# Patient Record
Sex: Male | Born: 1955
Health system: Southern US, Community
[De-identification: ages and names within clinical notes are randomized; demographics above are authoritative.]

## PROBLEM LIST (undated history)

## (undated) DIAGNOSIS — R7989 Other specified abnormal findings of blood chemistry: Secondary | ICD-10-CM

## (undated) DIAGNOSIS — M1990 Unspecified osteoarthritis, unspecified site: Secondary | ICD-10-CM

## (undated) DIAGNOSIS — G5600 Carpal tunnel syndrome, unspecified upper limb: Secondary | ICD-10-CM

## (undated) DIAGNOSIS — Z9889 Other specified postprocedural states: Secondary | ICD-10-CM

## (undated) DIAGNOSIS — T4145XA Adverse effect of unspecified anesthetic, initial encounter: Secondary | ICD-10-CM

## (undated) DIAGNOSIS — E785 Hyperlipidemia, unspecified: Secondary | ICD-10-CM

## (undated) DIAGNOSIS — R112 Nausea with vomiting, unspecified: Secondary | ICD-10-CM

## (undated) DIAGNOSIS — R7303 Prediabetes: Secondary | ICD-10-CM

## (undated) DIAGNOSIS — K5792 Diverticulitis of intestine, part unspecified, without perforation or abscess without bleeding: Secondary | ICD-10-CM

## (undated) DIAGNOSIS — T8859XA Other complications of anesthesia, initial encounter: Secondary | ICD-10-CM

## (undated) DIAGNOSIS — K5720 Diverticulitis of large intestine with perforation and abscess without bleeding: Secondary | ICD-10-CM

## (undated) DIAGNOSIS — L4050 Arthropathic psoriasis, unspecified: Secondary | ICD-10-CM

## (undated) DIAGNOSIS — I10 Essential (primary) hypertension: Secondary | ICD-10-CM

## (undated) HISTORY — PX: CARPAL TUNNEL RELEASE: SHX101

## (undated) HISTORY — PX: GANGLION CYST EXCISION: SHX1691

## (undated) HISTORY — PX: CERVICAL FUSION: SHX112

## (undated) HISTORY — PX: OTHER SURGICAL HISTORY: SHX169

## (undated) HISTORY — PX: TYMPANOSTOMY TUBE PLACEMENT: SHX32

## (undated) HISTORY — PX: ARTHROSCOPIC REPAIR ACL: SUR80

## (undated) HISTORY — PX: TONSILLECTOMY: SUR1361

## (undated) HISTORY — PX: SHOULDER ARTHROSCOPY W/ ROTATOR CUFF REPAIR: SHX2400

## (undated) HISTORY — DX: Unspecified osteoarthritis, unspecified site: M19.90

---
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
## 2000-07-11 ENCOUNTER — Encounter: Admission: RE | Admit: 2000-07-11 | Discharge: 2000-07-20 | Payer: Self-pay | Admitting: Orthopedic Surgery

## 2000-08-10 ENCOUNTER — Ambulatory Visit (HOSPITAL_COMMUNITY): Admission: RE | Admit: 2000-08-10 | Discharge: 2000-08-10 | Payer: Self-pay | Admitting: Orthopedic Surgery

## 2000-08-10 ENCOUNTER — Encounter: Payer: Self-pay | Admitting: Orthopedic Surgery

## 2002-09-12 ENCOUNTER — Encounter: Admission: RE | Admit: 2002-09-12 | Discharge: 2002-09-12 | Payer: Self-pay | Admitting: Family Medicine

## 2002-09-12 ENCOUNTER — Encounter: Payer: Self-pay | Admitting: Family Medicine

## 2003-11-04 ENCOUNTER — Ambulatory Visit (HOSPITAL_COMMUNITY): Admission: RE | Admit: 2003-11-04 | Discharge: 2003-11-05 | Payer: Self-pay | Admitting: Orthopaedic Surgery

## 2005-04-13 ENCOUNTER — Ambulatory Visit (HOSPITAL_COMMUNITY): Admission: RE | Admit: 2005-04-13 | Discharge: 2005-04-13 | Payer: Self-pay | Admitting: Orthopedic Surgery

## 2005-06-15 ENCOUNTER — Ambulatory Visit (HOSPITAL_BASED_OUTPATIENT_CLINIC_OR_DEPARTMENT_OTHER): Admission: RE | Admit: 2005-06-15 | Discharge: 2005-06-15 | Payer: Self-pay | Admitting: Orthopedic Surgery

## 2011-02-27 ENCOUNTER — Other Ambulatory Visit: Payer: Self-pay | Admitting: Family Medicine

## 2011-02-27 DIAGNOSIS — N508 Other specified disorders of male genital organs: Secondary | ICD-10-CM

## 2011-02-28 ENCOUNTER — Ambulatory Visit
Admission: RE | Admit: 2011-02-28 | Discharge: 2011-02-28 | Disposition: A | Discharge status: Home / Self Care | Payer: 59 | Source: Ambulatory Visit | Attending: Family Medicine | Admitting: Family Medicine

## 2011-02-28 DIAGNOSIS — N508 Other specified disorders of male genital organs: Secondary | ICD-10-CM

## 2011-05-18 ENCOUNTER — Other Ambulatory Visit: Payer: Self-pay | Admitting: Orthopedic Surgery

## 2011-05-23 ENCOUNTER — Encounter (HOSPITAL_BASED_OUTPATIENT_CLINIC_OR_DEPARTMENT_OTHER): Payer: Self-pay | Admitting: *Deleted

## 2011-05-23 NOTE — Progress Notes (Signed)
To come in for [id number] bring names of meds

## 2011-05-24 ENCOUNTER — Encounter (HOSPITAL_BASED_OUTPATIENT_CLINIC_OR_DEPARTMENT_OTHER)
Admission: RE | Admit: 2011-05-24 | Discharge: 2011-05-24 | Disposition: A | Discharge status: Home / Self Care | Payer: 59 | Source: Ambulatory Visit | Attending: Orthopedic Surgery | Admitting: Orthopedic Surgery

## 2011-05-24 ENCOUNTER — Other Ambulatory Visit: Payer: Self-pay

## 2011-05-24 LAB — BASIC METABOLIC PANEL
CO2: 31 mEq/L (ref 19–32)
Chloride: 101 mEq/L (ref 96–112)
GFR calc non Af Amer: >90 mL/min (ref >90)
Glucose, Bld: 107 mg/dL — ABNORMAL HIGH (ref 70–99)
Sodium: 141 mEq/L (ref 135–145)

## 2011-05-25 ENCOUNTER — Encounter (HOSPITAL_BASED_OUTPATIENT_CLINIC_OR_DEPARTMENT_OTHER): Payer: Self-pay | Admitting: Anesthesiology

## 2011-05-25 ENCOUNTER — Encounter (HOSPITAL_BASED_OUTPATIENT_CLINIC_OR_DEPARTMENT_OTHER): Payer: Self-pay | Admitting: Orthopedic Surgery

## 2011-05-25 ENCOUNTER — Ambulatory Visit (HOSPITAL_BASED_OUTPATIENT_CLINIC_OR_DEPARTMENT_OTHER)
Admission: RE | Admit: 2011-05-25 | Discharge: 2011-05-25 | Disposition: A | Discharge status: Home / Self Care | Payer: 59 | Source: Ambulatory Visit | Attending: Orthopedic Surgery | Admitting: Orthopedic Surgery

## 2011-05-25 ENCOUNTER — Encounter (HOSPITAL_BASED_OUTPATIENT_CLINIC_OR_DEPARTMENT_OTHER): Payer: Self-pay | Admitting: Certified Registered"

## 2011-05-25 ENCOUNTER — Ambulatory Visit (HOSPITAL_BASED_OUTPATIENT_CLINIC_OR_DEPARTMENT_OTHER): Payer: 59 | Admitting: Anesthesiology

## 2011-05-25 ENCOUNTER — Encounter (HOSPITAL_BASED_OUTPATIENT_CLINIC_OR_DEPARTMENT_OTHER): Payer: Self-pay | Admitting: *Deleted

## 2011-05-25 ENCOUNTER — Encounter (HOSPITAL_BASED_OUTPATIENT_CLINIC_OR_DEPARTMENT_OTHER)
Admission: RE | Disposition: A | Discharge status: Home / Self Care | Payer: Self-pay | Source: Ambulatory Visit | Attending: Orthopedic Surgery

## 2011-05-25 DIAGNOSIS — Z01812 Encounter for preprocedural laboratory examination: Secondary | ICD-10-CM | POA: Insufficient documentation

## 2011-05-25 DIAGNOSIS — I10 Essential (primary) hypertension: Secondary | ICD-10-CM | POA: Insufficient documentation

## 2011-05-25 DIAGNOSIS — Z01810 Encounter for preprocedural cardiovascular examination: Secondary | ICD-10-CM | POA: Insufficient documentation

## 2011-05-25 DIAGNOSIS — G5600 Carpal tunnel syndrome, unspecified upper limb: Secondary | ICD-10-CM | POA: Insufficient documentation

## 2011-05-25 HISTORY — DX: Other specified postprocedural states: Z98.890

## 2011-05-25 HISTORY — DX: Other specified postprocedural states: R11.2

## 2011-05-25 HISTORY — DX: Other complications of anesthesia, initial encounter: T88.59XA

## 2011-05-25 HISTORY — DX: Adverse effect of unspecified anesthetic, initial encounter: T41.45XA

## 2011-05-25 HISTORY — PX: CARPAL TUNNEL RELEASE: SHX101

## 2011-05-25 HISTORY — DX: Carpal tunnel syndrome, unspecified upper limb: G56.00

## 2011-05-25 HISTORY — DX: Essential (primary) hypertension: I10

## 2011-05-25 HISTORY — DX: Hyperlipidemia, unspecified: E78.5

## 2011-05-25 LAB — POCT HEMOGLOBIN-HEMACUE: Hemoglobin: 16.3 g/dL (ref 13.0–17.0)

## 2011-05-25 SURGERY — CARPAL TUNNEL RELEASE
Anesthesia: General | Site: Wrist | Laterality: Right | Wound class: Clean

## 2011-05-25 MED ORDER — MIDAZOLAM HCL 5 MG/5ML IJ SOLN
INTRAMUSCULAR | Status: DC | PRN
  Administered 2011-05-25: 2 mg via INTRAVENOUS

## 2011-05-25 MED ORDER — CHLORHEXIDINE GLUCONATE 4 % EX LIQD: 60.0000 mL | Freq: Once | CUTANEOUS | Status: DC

## 2011-05-25 MED ORDER — LIDOCAINE HCL 2 % IJ SOLN
INTRAMUSCULAR | Status: DC | PRN
  Administered 2011-05-25: 4.5 mL

## 2011-05-25 MED ORDER — PROPOFOL 10 MG/ML IV EMUL
INTRAVENOUS | Status: DC | PRN
  Administered 2011-05-25: 200 mg via INTRAVENOUS

## 2011-05-25 MED ORDER — DEXAMETHASONE SODIUM PHOSPHATE 10 MG/ML IJ SOLN
INTRAMUSCULAR | Status: DC | PRN
  Administered 2011-05-25: 10 mg via INTRAVENOUS

## 2011-05-25 MED ORDER — ONDANSETRON HCL 4 MG/2ML IJ SOLN
INTRAMUSCULAR | Status: DC | PRN
  Administered 2011-05-25: 4 mg via INTRAVENOUS

## 2011-05-25 MED ORDER — FENTANYL CITRATE 0.05 MG/ML IJ SOLN
INTRAMUSCULAR | Status: DC | PRN
  Administered 2011-05-25: 25 ug via INTRAVENOUS
  Administered 2011-05-25: 100 ug via INTRAVENOUS

## 2011-05-25 MED ORDER — MEPERIDINE HCL 25 MG/ML IJ SOLN: 6.2500 mg | INTRAMUSCULAR | Status: DC | PRN

## 2011-05-25 MED ORDER — FENTANYL CITRATE 0.05 MG/ML IJ SOLN: 25.0000 ug | INTRAMUSCULAR | Status: DC | PRN

## 2011-05-25 MED ORDER — LACTATED RINGERS IV SOLN
INTRAVENOUS | Status: DC
  Administered 2011-05-25 (×2): via INTRAVENOUS

## 2011-05-25 MED ORDER — OXYCODONE-ACETAMINOPHEN 5-325 MG PO TABS
1.0000 | ORAL_TABLET | Freq: Once | ORAL | Status: AC
  Administered 2011-05-25: 1 via ORAL

## 2011-05-25 MED ORDER — LIDOCAINE HCL (CARDIAC) 20 MG/ML IV SOLN
INTRAVENOUS | Status: DC | PRN
  Administered 2011-05-25: 60 mg via INTRAVENOUS

## 2011-05-25 MED ORDER — OXYCODONE-ACETAMINOPHEN 5-325 MG PO TABS: ORAL_TABLET | ORAL | Status: DC

## 2011-05-25 MED ORDER — PROMETHAZINE HCL 25 MG/ML IJ SOLN: 6.2500 mg | INTRAMUSCULAR | Status: DC | PRN

## 2011-05-25 SURGICAL SUPPLY — 42 items
BANDAGE ADHESIVE 1X3 (GAUZE/BANDAGES/DRESSINGS) IMPLANT
BANDAGE ELASTIC 3 VELCRO ST LF (GAUZE/BANDAGES/DRESSINGS) ×2 IMPLANT
BLADE SURG 15 STRL LF DISP TIS (BLADE) ×1 IMPLANT
BLADE SURG 15 STRL SS (BLADE) ×2
BNDG CMPR 9X4 STRL LF SNTH (GAUZE/BANDAGES/DRESSINGS) ×1
BNDG ESMARK 4X9 LF (GAUZE/BANDAGES/DRESSINGS) ×2 IMPLANT
BRUSH SCRUB EZ PLAIN DRY (MISCELLANEOUS) ×2 IMPLANT
CLOSURE STERI STRIP 1/2 X4 (GAUZE/BANDAGES/DRESSINGS) ×2 IMPLANT
CLOTH BEACON ORANGE TIMEOUT ST (SAFETY) ×2 IMPLANT
CORDS BIPOLAR (ELECTRODE) ×2 IMPLANT
COVER MAYO STAND STRL (DRAPES) ×2 IMPLANT
COVER TABLE BACK 60X90 (DRAPES) ×2 IMPLANT
CUFF TOURNIQUET SINGLE 18IN (TOURNIQUET CUFF) ×2 IMPLANT
DECANTER SPIKE VIAL GLASS SM (MISCELLANEOUS) ×2 IMPLANT
DRAPE EXTREMITY T 121X128X90 (DRAPE) ×2 IMPLANT
DRAPE SURG 17X23 STRL (DRAPES) ×2 IMPLANT
GLOVE BIO SURGEON STRL SZ 6.5 (GLOVE) ×4 IMPLANT
GLOVE BIOGEL M STRL SZ7.5 (GLOVE) ×2 IMPLANT
GLOVE ORTHO TXT STRL SZ7.5 (GLOVE) ×2 IMPLANT
GOWN PREVENTION PLUS XLARGE (GOWN DISPOSABLE) ×2 IMPLANT
GOWN PREVENTION PLUS XXLARGE (GOWN DISPOSABLE) ×4 IMPLANT
NEEDLE 27GAX1X1/2 (NEEDLE) ×2 IMPLANT
PACK BASIN DAY SURGERY FS (CUSTOM PROCEDURE TRAY) ×2 IMPLANT
PAD CAST 3X4 CTTN HI CHSV (CAST SUPPLIES) ×1 IMPLANT
PADDING CAST ABS 4INX4YD NS (CAST SUPPLIES) ×1
PADDING CAST ABS COTTON 4X4 ST (CAST SUPPLIES) ×1 IMPLANT
PADDING CAST COTTON 3X4 STRL (CAST SUPPLIES) ×1
PADDING WEBRIL 3 STERILE (GAUZE/BANDAGES/DRESSINGS) ×2 IMPLANT
SPLINT PLASTER CAST XFAST 3X15 (CAST SUPPLIES) ×5 IMPLANT
SPLINT PLASTER EXTRA FAST 3X15 (CAST SUPPLIES) ×1
SPLINT PLASTER GYPS XFAST 3X15 (CAST SUPPLIES) ×1 IMPLANT
SPLINT PLASTER XTRA FASTSET 3X (CAST SUPPLIES) ×5
SPONGE GAUZE 4X4 12PLY (GAUZE/BANDAGES/DRESSINGS) ×2 IMPLANT
SPONGE GAUZE 4X4 16PLY NONSTR (GAUZE/BANDAGES/DRESSINGS) ×2 IMPLANT
STOCKINETTE 4X48 STRL (DRAPES) ×2 IMPLANT
STRIP CLOSURE SKIN 1/2X4 (GAUZE/BANDAGES/DRESSINGS) ×2 IMPLANT
SUT PROLENE 3 0 PS 2 (SUTURE) ×2 IMPLANT
SYR 3ML 23GX1 SAFETY (SYRINGE) IMPLANT
SYR CONTROL 10ML LL (SYRINGE) ×2 IMPLANT
TRAY DSU PREP LF (CUSTOM PROCEDURE TRAY) ×2 IMPLANT
UNDERPAD 30X30 INCONTINENT (UNDERPADS AND DIAPERS) ×2 IMPLANT
WATER STERILE IRR 1000ML POUR (IV SOLUTION) ×2 IMPLANT

## 2011-05-25 NOTE — Anesthesia Procedure Notes (Signed)
Procedure Name: LMA Insertion Date/Time: 05/25/2011 10:46 AM Performed by: Radford Pax Pre-anesthesia Checklist: Patient identified, Emergency Drugs available, Suction available, Patient being monitored and Timeout performed Patient Re-evaluated:Patient Re-evaluated prior to inductionOxygen Delivery Method: Circle System Utilized Preoxygenation: Pre-oxygenation with 100% oxygen Intubation Type: IV induction Ventilation: Mask ventilation without difficulty LMA: LMA with gastric port inserted LMA Size: 4.0 Number of attempts: 1 (atraumatic) Placement Confirmation: positive ETCO2 Tube secured with: Tape Dental Injury: Teeth and Oropharynx as per pre-operative assessment

## 2011-05-25 NOTE — Anesthesia Postprocedure Evaluation (Signed)
  Anesthesia Post-op Note  Patient: John Morse  Procedure(s) Performed:  CARPAL TUNNEL RELEASE  Patient Location: PACU  Anesthesia Type: General  Level of Consciousness: awake, alert  and oriented  Airway and Oxygen Therapy: Patient Spontanous Breathing  Post-op Pain: none  Post-op Assessment: Post-op Vital signs reviewed, Patient's Cardiovascular Status Stable, Respiratory Function Stable, Patent Airway and No signs of Nausea or vomiting  Post-op Vital Signs: Reviewed and stable  Complications: No apparent anesthesia complications

## 2011-05-25 NOTE — H&P (Signed)
John Morse is an 56 y.o. male.   Chief Complaint: Complaining of chronic and progressive right hand numbness and tingling HPI: Patient is a 56 year old right-hand-dominant male who presented to our office recently for evaluation and treatment of chronic and progressive right hand numbness and tingling. He previously undergone left carpal tunnel release by Dr. Katy Fitch. John Morse with good success. He is currently awake and 5 times a week despite nocturnal splinting. Review of his nerve conduction studies revealed a moderate carpal tunnel syndrome. Patient wishes to proceed with surgical intervention.  Past Medical History  Diagnosis Date  . Complication of anesthesia     vomiting  . PONV (postoperative nausea and vomiting)   . Hypertension   . Hyperlipemia   . Carpal tunnel syndrome     rt    Past Surgical History  Procedure Date  . Shoulder arthroscopy w/ rotator cuff repair     rightx3  . Shoulder arthroscopy w/ rotator cuff repair     left   . Arthroscopic repair acl     rt knee  . Cervical fusion   . Carpal tunnel release     lt  . Tonsillectomy     History reviewed. No pertinent family history. Social History:  reports that he quit smoking about 14 years ago. He does not have any smokeless tobacco history on file. He reports that he drinks alcohol. He reports that he does not use illicit drugs.  Allergies:  Allergies  Allergen Reactions  . Codeine Nausea And Vomiting    Medications Prior to Admission  Medication Dose Route Frequency Provider Last Rate Last Dose  . chlorhexidine (HIBICLENS) 4 % liquid 4 application  60 mL Topical Once       . lactated ringers infusion   Intravenous Continuous Constance Goltz, MD       Medications Prior to Admission  Medication Sig Dispense Refill  . amLODipine (NORVASC) 2.5 MG tablet Take 2.5 mg by mouth daily.      Marland Kitchen atenolol-chlorthalidone (TENORETIC) 50-25 MG per tablet Take 1 tablet by mouth daily.      . folic acid  (FOLVITE) 400 MCG tablet Take 400 mcg by mouth daily.      . pravastatin (PRAVACHOL) 40 MG tablet Take 40 mg by mouth daily.      . vitamin E 400 UNIT capsule Take 400 Units by mouth daily.        Results for orders placed during the hospital encounter of 05/25/11 (from the past 48 hour(s))  BASIC METABOLIC PANEL     Status: Abnormal   Collection Time   05/24/11  8:30 AM      Component Value Range Comment   Sodium 141  135 - 145 (mEq/L)    Potassium 3.4 (*) 3.5 - 5.1 (mEq/L)    Chloride 101  96 - 112 (mEq/L)    CO2 31  19 - 32 (mEq/L)    Glucose, Bld 107 (*) 70 - 99 (mg/dL)    BUN 17  6 - 23 (mg/dL)    Creatinine, Ser 1.47  0.50 - 1.35 (mg/dL)    Calcium 9.4  8.4 - 10.5 (mg/dL)    GFR calc non Af Amer >90  >90 (mL/min)    GFR calc Af Amer >90  >90 (mL/min)     No results found.   Pertinent items are noted in HPI.  Height 6' (1.829 m), weight 106.595 kg (235 lb).  General appearance: alert Head: Normocephalic, without obvious abnormality Neck: supple,  symmetrical, trachea midline Resp: clear to auscultation bilaterally Cardio: regular rate and rhythm, S1, S2 normal, no murmur, click, rub or gallop GI: normal findings: bowel sounds normal Extremities: Examination of his right hand reveals a positive Phalen's and positive Tinel's. He has normal sweat pattern. He has full range of motion of his fingers and wrist. Review of his nerve conduction studies reveals a moderate right carpal tunnel syndrome. Pulses: 2+ and symmetric Skin: normal Neurologic: Grossly normal    Assessment/Plan Impression: Right carpal tunnel syndrome  Plan: Patient to be taken to the operating room to undergo right carpal tunnel release. The procedure risks benefits and postoperative course were discussed with the patient at length and he was in agreement with this plan.  DASNOIT,Davanna He J 05/25/2011, 9:05 AM    H&P documentation: 05/25/2011  -History and Physical Reviewed  -Patient has been  re-examined  -No change in the plan of care  Wyn Forster, MD

## 2011-05-25 NOTE — Op Note (Signed)
OP NOTE DICTATED: 05/25/11 191478

## 2011-05-25 NOTE — Anesthesia Preprocedure Evaluation (Signed)
Anesthesia Evaluation  Patient identified by MRN, date of birth, ID band Patient awake    Reviewed: Allergy & Precautions, H&P , NPO status , Patient's Chart, lab work & pertinent test results, reviewed documented beta blocker date and time   History of Anesthesia Complications (+) PONV  Airway Mallampati: I TM Distance: >3 FB Neck ROM: Full    Dental No notable dental hx. (+) Teeth Intact   Pulmonary neg pulmonary ROS,  clear to auscultation  Pulmonary exam normal       Cardiovascular hypertension, On Medications and On Home Beta Blockers Regular Normal    Neuro/Psych  Neuromuscular disease Negative Neurological ROS  Negative Psych ROS   GI/Hepatic negative GI ROS, Neg liver ROS,   Endo/Other  Negative Endocrine ROS  Renal/GU negative Renal ROS  Genitourinary negative   Musculoskeletal   Abdominal (+) obese,   Peds  Hematology negative hematology ROS (+)   Anesthesia Other Findings   Reproductive/Obstetrics negative OB ROS                           Anesthesia Physical Anesthesia Plan  ASA: II  Anesthesia Plan: General   Post-op Pain Management:    Induction: Intravenous  Airway Management Planned: LMA  Additional Equipment:   Intra-op Plan:   Post-operative Plan: Extubation in OR  Informed Consent: I have reviewed the patients History and Physical, chart, labs and discussed the procedure including the risks, benefits and alternatives for the proposed anesthesia with the patient or authorized representative who has indicated his/her understanding and acceptance.     Plan Discussed with: CRNA and Surgeon  Anesthesia Plan Comments:         Anesthesia Quick Evaluation

## 2011-05-25 NOTE — Brief Op Note (Signed)
05/25/2011  11:11 AM  PATIENT:  John Morse  56 y.o. male  PRE-OPERATIVE DIAGNOSIS:  right carpal tunnel syndrome  POST-OPERATIVE DIAGNOSIS:  Right carpal tunnel syndrome  PROCEDURE:  Procedure(s): CARPAL TUNNEL RELEASE TWO INCISION TECHNIQUE  SURGEON:  Surgeon(s): Wyn Forster., MD  PHYSICIAN ASSISTANT:   ASSISTANTS:Ashani Pumphrey Dasnoit,P.A-C  ANESTHESIA:   general  EBL:  Total I/O In: 1000 [I.V.:1000] Out: -   BLOOD ADMINISTERED:none  DRAINS: none   LOCAL MEDICATIONS USED:  LIDOCAINE 3 CC 2%  SPECIMEN:  No Specimen  DISPOSITION OF SPECIMEN:  N/A  COUNTS:  YES  TOURNIQUET:  * Missing tourniquet times found for documented tourniquets in log:  17832 *  DICTATION: .Other Dictation: Dictation Number [id number]  PLAN OF CARE: Discharge to home after PACU  PATIENT DISPOSITION:  PACU - hemodynamically stable.   Delay start of Pharmacological VTE agent (>24hrs) due to surgical blood loss or risk of bleeding:  NO/NOT APPLICABLE

## 2011-05-25 NOTE — Transfer of Care (Signed)
Immediate Anesthesia Transfer of Care Note  Patient: John Morse  Procedure(s) Performed:  CARPAL TUNNEL RELEASE  Patient Location: PACU  Anesthesia Type: General  Level of Consciousness: awake, alert , oriented and patient cooperative  Airway & Oxygen Therapy: Patient Spontanous Breathing and Patient connected to face mask oxygen  Post-op Assessment: Report given to PACU RN and Post -op Vital signs reviewed and stable  Post vital signs: Reviewed and stable Filed Vitals:   05/25/11 0912  BP: 149/87  Pulse: 64  Temp: 36.7 C  Resp: 20    Complications: No apparent anesthesia complications

## 2011-05-26 ENCOUNTER — Encounter (HOSPITAL_BASED_OUTPATIENT_CLINIC_OR_DEPARTMENT_OTHER): Payer: Self-pay | Admitting: Orthopedic Surgery

## 2011-05-26 NOTE — Op Note (Signed)
John Morse, John Morse             ACCOUNT NO.:  [id number]  MEDICAL RECORD NO.:  [id number]  LOCATION:                                 FACILITY:  PHYSICIAN:  Katy Fitch. Sho Salguero, M.D.      DATE OF BIRTH:  DATE OF PROCEDURE:  05/25/2011 DATE OF DISCHARGE:                              OPERATIVE REPORT   PREOPERATIVE DIAGNOSIS:  Severe right carpal tunnel syndrome with early dystrophic-type response with hand stiffness.  POSTOPERATIVE DIAGNOSIS:  Severe right carpal tunnel syndrome with early dystrophic-type response with hand stiffness with electrodiagnostic studies confirming severe right carpal tunnel syndrome.  OPERATION:  Release of right transverse carpal ligament, ultimately requiring 2-incision technique for safe exposure of volar forearm fascia.  SURGEON:  Katy Fitch. Laurinda Carreno, MD  ASSISTANT:  Marveen Reeks Dasnoit, PA-C  ANESTHESIA:  General by LMA.  SUPERVISING ANESTHESIOLOGIST:  Zenon Mayo, MD  INDICATIONS:  John Morse is a well known 56 year old gentleman who is employed at [employer].  He is status post prior left carpal tunnel release with a good long-term result.  He returned with chronic numbness in the right hand.  He self diagnosed carpal tunnel syndrome. Clinical examination confirmed that he indeed had clinical signs of carpal tunnel syndrome.  He also had swelling on the volar aspect of his wrist, wrist stiffness and marked swelling of his hand with finger stiffness.  His exam was compatible with an acute exacerbation of carpal tunnel syndrome with a CRPS type 2 response.  We advised John Morse to have contemporary electrodiagnostic studies, which confirmed correction of his left side parameters and very significant right carpal tunnel syndrome.  His amplitudes were diminished.  We advised him to proceed with carpal tunnel release at this time. Preoperatively, he was reminded of potential risks and benefits of surgery.  PROCEDURE:   John Morse was brought to room 1 of the [facility] and placed in supine position on the operating table.  Following a detailed anesthesia informed consent by Dr. Sampson Goon, general anesthesia by LMA technique was recommended and accepted.  In room 1 under Dr. Jarrett Ables supervision, general anesthesia was induced followed by routine Betadine scrub and paint of the right upper extremity.  A pneumatic tourniquet was applied to proximal right brachium.  Following a routine surgical time-out, the right arm was exsanguinated with an Esmarch bandage and the arterial tourniquet inflated 220 mmHg. Procedure commenced with a short incision line of the ring finger of the palm.  Care was taken to avoid the heel of the hand.  Subcutaneous tissues were carefully divided revealing the mid palmar space.  This space was bulging with adipose tissue.  There was quite a bit swollen tenosynovium deep to the palmar fat.  The distal margin of the transcarpal ligament was identified following splitting of the palmar fascia.  The canal was sounded with a Penfield 4 elevator followed by separation of the median nerve proper from the transverse carpal ligament with a Insurance risk surveyor.  The transverse carpal ligament was released subcutaneously to the level of the distal wrist flexion crease.  At this level, there was a thick palmaris longus expansion into the palmar fascia, and quite a  bit of adipose tissue that obscured visualization of the forearm with subcutaneous technique.  Given this predicament for safe visualization of the median nerve in the distal forearm, I proceeded with a short transverse incision at the distal wrist flexion crease.  This allowed identification of the palmar cutaneous branch of the median nerve as well as the palmaris longus tendon.  The tendon was retracted in a radial direction.  The palmar cutaneous branch was protected followed by identification of the  forearm fascia.  This was meticulously released proximally and distally including release of the palmaris longus to facilitate full decompression of the median nerve.  The contents of the carpal canal were quite swollen.  They extruded out through the margins of the transverse carpal ligament.  There was remarkable swelling and edema of the ulnar bursa.  No other masses were appreciated.  The wound was then repaired with intradermal 3-0 Prolene in the palm, likewise repaired in the distal forearm followed by application of Steri- Strips.  John Morse was placed in compressive dressing with sterile gauze, sterile Webril and a volar plaster splint maintaining the wrist in 10 degrees of dorsiflexion.   For aftercare, he has provided prescription for Percocet 5/325, 1 or 2 tablets p.o. q.4-6 hours p.r.n. pain, 20 tablets without refill.  We will see him back for followup in our office in 7-8 days for dressing change, suture removal, and advancement to  postoperative exercise program.     Katy Fitch. Koji Niehoff, M.D.     RVS/MEDQ  D:  05/25/2011  T:  05/26/2011  Job:  [id number]

## 2012-04-03 ENCOUNTER — Other Ambulatory Visit: Payer: Self-pay | Admitting: Family Medicine

## 2012-04-03 DIAGNOSIS — N508 Other specified disorders of male genital organs: Secondary | ICD-10-CM

## 2012-04-08 ENCOUNTER — Ambulatory Visit
Admission: RE | Admit: 2012-04-08 | Discharge: 2012-04-08 | Disposition: A | Discharge status: Home / Self Care | Payer: 59 | Source: Ambulatory Visit | Attending: Family Medicine | Admitting: Family Medicine

## 2012-04-08 DIAGNOSIS — N508 Other specified disorders of male genital organs: Secondary | ICD-10-CM

## 2012-05-06 ENCOUNTER — Other Ambulatory Visit: Payer: Self-pay | Admitting: Family Medicine

## 2012-05-06 ENCOUNTER — Ambulatory Visit
Admission: RE | Admit: 2012-05-06 | Discharge: 2012-05-06 | Disposition: A | Discharge status: Home / Self Care | Payer: 59 | Source: Ambulatory Visit | Attending: Family Medicine | Admitting: Family Medicine

## 2012-05-06 DIAGNOSIS — R05 Cough: Secondary | ICD-10-CM

## 2012-05-06 DIAGNOSIS — R059 Cough, unspecified: Secondary | ICD-10-CM

## 2012-09-11 ENCOUNTER — Other Ambulatory Visit: Payer: Self-pay | Admitting: Family Medicine

## 2012-09-11 DIAGNOSIS — N5089 Other specified disorders of the male genital organs: Secondary | ICD-10-CM

## 2012-09-25 ENCOUNTER — Other Ambulatory Visit: Payer: 59

## 2012-09-25 ENCOUNTER — Ambulatory Visit
Admission: RE | Admit: 2012-09-25 | Discharge: 2012-09-25 | Disposition: A | Discharge status: Home / Self Care | Payer: 59 | Source: Ambulatory Visit | Attending: Family Medicine | Admitting: Family Medicine

## 2012-09-25 DIAGNOSIS — N5089 Other specified disorders of the male genital organs: Secondary | ICD-10-CM

## 2013-06-13 ENCOUNTER — Other Ambulatory Visit: Payer: Self-pay | Admitting: Gastroenterology

## 2013-11-27 ENCOUNTER — Ambulatory Visit (INDEPENDENT_AMBULATORY_CARE_PROVIDER_SITE_OTHER): Payer: 59

## 2013-11-27 ENCOUNTER — Ambulatory Visit: Payer: 59 | Admitting: Podiatry

## 2013-11-27 ENCOUNTER — Encounter: Payer: Self-pay | Admitting: Podiatry

## 2013-11-27 VITALS — BP 148/84 | HR 56 | Resp 16 | Ht 72.0 in | Wt 240.0 lb

## 2013-11-27 DIAGNOSIS — M779 Enthesopathy, unspecified: Secondary | ICD-10-CM

## 2013-11-27 DIAGNOSIS — M79609 Pain in unspecified limb: Secondary | ICD-10-CM

## 2013-11-27 DIAGNOSIS — M79672 Pain in left foot: Secondary | ICD-10-CM

## 2013-11-27 MED ORDER — TRIAMCINOLONE ACETONIDE 10 MG/ML IJ SUSP
10.0000 mg | Freq: Once | INTRAMUSCULAR | Status: AC
  Administered 2013-11-27: 10 mg

## 2013-11-27 MED ORDER — DICLOFENAC SODIUM 75 MG PO TBEC: 75.0000 mg | DELAYED_RELEASE_TABLET | Freq: Two times a day (BID) | ORAL | Status: DC

## 2013-11-27 NOTE — Progress Notes (Signed)
   Subjective:    Patient ID: John Morse, male    DOB: [birth year], 58 y.o.   MRN: [id number]  HPI Comments: Pt states cycling and now has a severe pain in plantar left 2nd MPJ, for 6 months.  Pt states he equates the pain to like a stone bruise.  Foot Pain      Review of Systems  All other systems reviewed and are negative.      Objective:   Physical Exam        Assessment & Plan:

## 2013-11-28 NOTE — Progress Notes (Signed)
Subjective:     Patient ID: John Morse, male   DOB: [birth year], 58 y.o.   MRN: [id number]  Foot Pain   patient states that he cycles and that he has had a lot of pain in his left foot that's been going on for 6 months these tried padding anti-inflammatories and ice without relief of symptoms   Review of Systems  All other systems reviewed and are negative.      Objective:   Physical Exam  Nursing note and vitals reviewed. Constitutional: He is oriented to person, place, and time.  Cardiovascular: Intact distal pulses.   Musculoskeletal: Normal range of motion.  Neurological: He is oriented to person, place, and time.  Skin: Skin is warm.   neurovascular status intact with muscle strength unchanged and patient noted to have normal range of motion subtalar midtarsal joint. Patient has fluid buildup around the second MPJ with inflammation and pain in the joint with no indications so far of digital movement and is noted to have well-perfused digits and normal arch height     Assessment:     Inflammatory capsulitis second MPJ left foot    Plan:     H&P performed and today I did a proximal nerve block and then aspirated the second MPJ and was able to get out a small amount of clear fluid and injected with half cc of dexamethasone Kenalog combination with padding. Also dispensed a graphite insole and discussed orthotics and reappoint in 2-3 weeks

## 2013-12-11 ENCOUNTER — Ambulatory Visit (INDEPENDENT_AMBULATORY_CARE_PROVIDER_SITE_OTHER): Payer: 59 | Admitting: Podiatry

## 2013-12-11 ENCOUNTER — Encounter: Payer: Self-pay | Admitting: Podiatry

## 2013-12-11 VITALS — BP 147/75 | HR 66 | Resp 16

## 2013-12-11 DIAGNOSIS — M779 Enthesopathy, unspecified: Secondary | ICD-10-CM

## 2013-12-11 NOTE — Progress Notes (Signed)
Subjective:     Patient ID: John Morse, male   DOB: 1955-09-20, 58 y.o.   MRN: [id number]  HPI patient states my foot feels much better but I still feel that marble pain around the second metatarsal and I have to wear pads when I'm bike   Review of Systems     Objective:   Physical Exam Neurovascular status unchanged with thick numbness still noted around the second MPJ left with significant reduction of pain but still inflammation noted    Assessment:     Capsulitis second MPJ which is improving with aggressive treatment    Plan:     Educated patient on continued pad usage graphite insole and scanned today for custom orthotics to reduce the stress against the joint. Reappoint when they are ready

## 2014-01-02 ENCOUNTER — Ambulatory Visit: Payer: 59

## 2014-01-02 DIAGNOSIS — M779 Enthesopathy, unspecified: Secondary | ICD-10-CM

## 2014-01-02 NOTE — Patient Instructions (Signed)

## 2014-01-02 NOTE — Progress Notes (Signed)
Pt is here to PUO 

## 2014-01-21 ENCOUNTER — Encounter: Payer: Self-pay | Admitting: Podiatry

## 2014-01-21 ENCOUNTER — Ambulatory Visit (INDEPENDENT_AMBULATORY_CARE_PROVIDER_SITE_OTHER): Payer: 59 | Admitting: Podiatry

## 2014-01-21 ENCOUNTER — Ambulatory Visit (INDEPENDENT_AMBULATORY_CARE_PROVIDER_SITE_OTHER): Payer: 59

## 2014-01-21 VITALS — BP 153/74 | HR 60 | Resp 16

## 2014-01-21 DIAGNOSIS — R52 Pain, unspecified: Secondary | ICD-10-CM

## 2014-01-21 DIAGNOSIS — M779 Enthesopathy, unspecified: Secondary | ICD-10-CM

## 2014-01-21 MED ORDER — TRIAMCINOLONE ACETONIDE 10 MG/ML IJ SUSP
10.0000 mg | Freq: Once | INTRAMUSCULAR | Status: AC
  Administered 2014-01-21: 10 mg

## 2014-01-22 ENCOUNTER — Ambulatory Visit: Payer: 59 | Admitting: Podiatry

## 2014-01-23 NOTE — Progress Notes (Signed)
Subjective:     Patient ID: John Morse, male   DOB: [birth year], 58 y.o.   MRN: [id number]  HPI patient continues to experience discomfort in the second metatarsophalangeal joint. Stating he had a nice. Of relief followed by reoccurrence and he is due to doing 80 mile bike race this weekend   Review of Systems     Objective:   Physical Exam Neurovascular status intact with muscle strength adequate and range of motion subtalar and midtarsal joint within normal limits. Patient's noted to have inflammation and pain second metatarsophalangeal joint with no indications currently of digital malposition    Assessment:     Reoccurrence of inflammatory capsulitis second metatarsophalangeal joint left    Plan:     Reviewed continued insert usage and injection treatment. Exquisite of injection he wants to have it done and today I did a proximal nerve block aspirated the joint and injected within and around the joint with a quarter cc of dexamethasone Kenalog and applied padding. Reappoint to recheck

## 2014-01-25 LAB — BODY FLUID CULTURE
GRAM STAIN: NONE SEEN
Gram Stain: NONE SEEN
ORGANISM ID, BACTERIA: NO GROWTH

## 2014-02-18 ENCOUNTER — Encounter: Payer: Self-pay | Admitting: Podiatry

## 2014-02-18 ENCOUNTER — Ambulatory Visit (INDEPENDENT_AMBULATORY_CARE_PROVIDER_SITE_OTHER): Payer: 59

## 2014-02-18 ENCOUNTER — Ambulatory Visit (INDEPENDENT_AMBULATORY_CARE_PROVIDER_SITE_OTHER): Payer: 59 | Admitting: Podiatry

## 2014-02-18 VITALS — BP 146/84 | HR 69 | Resp 12

## 2014-02-18 DIAGNOSIS — M779 Enthesopathy, unspecified: Secondary | ICD-10-CM

## 2014-02-18 NOTE — Patient Instructions (Signed)
Pre-Operative Instructions  Congratulations, you have decided to take an important step to improving your quality of life.  You can be assured that the doctors of [facility] will be with you every step of the way.  1. Plan to be at the surgery center/hospital at least 1 (one) hour prior to your scheduled time unless otherwise directed by the surgical center/hospital staff.  You must have a responsible adult accompany you, remain during the surgery and drive you home.  Make sure you have directions to the surgical center/hospital and know how to get there on time. 2. For hospital based surgery you will need to obtain a history and physical form from your family physician within 1 month prior to the date of surgery- we will give you a form for you primary physician.  3. We make every effort to accommodate the date you request for surgery.  There are however, times where surgery dates or times have to be moved.  We will contact you as soon as possible if a change in schedule is required.   4. No Aspirin/Ibuprofen for one week before surgery.  If you are on aspirin, any non-steroidal anti-inflammatory medications (Mobic, Aleve, Ibuprofen) you should stop taking it 7 days prior to your surgery.  You make take Tylenol  For pain prior to surgery.  5. Medications- If you are taking daily heart and blood pressure medications, seizure, reflux, allergy, asthma, anxiety, pain or diabetes medications, make sure the surgery center/hospital is aware before the day of surgery so they may notify you which medications to take or avoid the day of surgery. 6. No food or drink after midnight the night before surgery unless directed otherwise by surgical center/hospital staff. 7. No alcoholic beverages 24 hours prior to surgery.  No smoking 24 hours prior to or 24 hours after surgery. 8. Wear loose pants or shorts- loose enough to fit over bandages, boots, and casts. 9. No slip on shoes, sneakers are best. 10. Bring  your boot with you to the surgery center/hospital.  Also bring crutches or a walker if your physician has prescribed it for you.  If you do not have this equipment, it will be provided for you after surgery. 11. If you have not been contracted by the surgery center/hospital by the day before your surgery, call to confirm the date and time of your surgery. 12. Leave-time from work may vary depending on the type of surgery you have.  Appropriate arrangements should be made prior to surgery with your employer. 13. Prescriptions will be provided immediately following surgery by your doctor.  Have these filled as soon as possible after surgery and take the medication as directed. 14. Remove nail polish on the operative foot. 15. Wash the night before surgery.  The night before surgery wash the foot and leg well with the antibacterial soap provided and water paying special attention to beneath the toenails and in between the toes.  Rinse thoroughly with water and dry well with a towel.  Perform this wash unless told not to do so by your physician.  Enclosed: 1 Ice pack (please put in freezer the night before surgery)   1 Hibiclens skin cleaner   Pre-op Instructions  If you have any questions regarding the instructions, do not hesitate to call our office.  [place]: [address] [place], [place] [address] [phone]  [place]: [address] , [place] [address] [phone]  [place]: [address]  [place], [place] [address] [phone]  Dr. Richard   Tuchman DPM, Dr. Norman Regal DPM Dr. Richard Sikora DPM, Dr. M. Todd Hyatt DPM, Dr. Kathryn Egerton DPM 

## 2014-02-18 NOTE — Progress Notes (Signed)
Subjective:     Patient ID: John Morse, male   DOB: 1955/07/14, 58 y.o.   MRN: [id number]  HPI patient presents stating that I'm still having a lot of pain in my forefoot left and I get relief for a short period of time after the injection followed by reoccurrence and I have not been able to do the activities that I enjoy   Review of Systems     Objective:   Physical Exam Neurovascular status intact with muscle strength adequate and range of motion of the subtalar and midtarsal joint within normal limits. Patient's second MPJ left remains very tender when pressed with inflammation and fluid within the joint itself    Assessment:     Chronic capsulitis with inflammatory changes second MPJ left foot    Plan:     Reviewed condition and 8 month history of problem. At this time due to long-standing nature that he to respond to conservative care I have recommended fixing this with shortening osteotomy and flushing of the second MPJ joint. I explained is no guarantee that this will solve his problem and I allowed him to read a consent form reviewing all possible complications associated with this surgery and patient wants procedure and signed consent form after review. Patient is scheduled for outpatient surgery and given preoperative instructions and is encouraged to call with any questions

## 2014-03-03 ENCOUNTER — Encounter: Payer: Self-pay | Admitting: Podiatry

## 2014-03-03 DIAGNOSIS — M21542 Acquired clubfoot, left foot: Secondary | ICD-10-CM

## 2014-03-08 NOTE — Progress Notes (Signed)
Dr Paulla Dolly performed a 2nd left met osteotomy on 03/03/14

## 2014-03-09 ENCOUNTER — Encounter: Payer: Self-pay | Admitting: Podiatry

## 2014-03-09 ENCOUNTER — Ambulatory Visit (INDEPENDENT_AMBULATORY_CARE_PROVIDER_SITE_OTHER): Payer: 59

## 2014-03-09 ENCOUNTER — Ambulatory Visit (INDEPENDENT_AMBULATORY_CARE_PROVIDER_SITE_OTHER): Payer: 59 | Admitting: Podiatry

## 2014-03-09 VITALS — BP 171/74 | HR 79 | Resp 16

## 2014-03-09 DIAGNOSIS — M779 Enthesopathy, unspecified: Secondary | ICD-10-CM

## 2014-03-09 DIAGNOSIS — M79672 Pain in left foot: Secondary | ICD-10-CM

## 2014-03-10 NOTE — Progress Notes (Signed)
Subjective:     Patient ID: John Morse, male   DOB: 1956/01/23, 58 y.o.   MRN: [id number]  HPIpatient states I'm doing well with minimal discomfort and I been walking with the boot without swelling   Review of Systems     Objective:   Physical Exam Neurovascular status intact with well-healing surgical site dorsum second metatarsal left with negative Homans sign noted and wound edges that are well coapted    Assessment:     Healing well from osteotomy second metatarsal left    Plan:     Reviewed x-rays and reapplied sterile dressing. Continue immobilization and dispensed surgical shoe to utilize along with boot and reappoint her recheck in 3 weeks

## 2014-04-06 ENCOUNTER — Ambulatory Visit (INDEPENDENT_AMBULATORY_CARE_PROVIDER_SITE_OTHER): Payer: 59 | Admitting: Podiatry

## 2014-04-06 ENCOUNTER — Ambulatory Visit (INDEPENDENT_AMBULATORY_CARE_PROVIDER_SITE_OTHER): Payer: 59

## 2014-04-06 VITALS — BP 125/78 | HR 74 | Resp 16

## 2014-04-06 DIAGNOSIS — Z9889 Other specified postprocedural states: Secondary | ICD-10-CM

## 2014-04-07 NOTE — Progress Notes (Signed)
Subjective:     Patient ID: John Morse, male   DOB: [birth year], 58 y.o.   MRN: [id number]  HPI patient presents stating I'm doing well on this left foot with minimal discomfort and I'm starting to walk   Review of Systems     Objective:   Physical Exam Neurovascular status intact negative Homans sign noted with well coapted incision site second metatarsal left    Assessment:     Doing well post osteotomy second metatarsal left    Plan:     X-ray reviewed and allow patient to return to increase activity and continue with elevation compression as needed. Reappoint in 8 weeks

## 2014-05-04 ENCOUNTER — Ambulatory Visit
Admission: RE | Admit: 2014-05-04 | Discharge: 2014-05-04 | Disposition: A | Discharge status: Home / Self Care | Payer: 59 | Source: Ambulatory Visit | Attending: Family Medicine | Admitting: Family Medicine

## 2014-05-04 ENCOUNTER — Other Ambulatory Visit: Payer: Self-pay | Admitting: Family Medicine

## 2014-05-04 DIAGNOSIS — R079 Chest pain, unspecified: Secondary | ICD-10-CM

## 2016-06-19 DIAGNOSIS — L4059 Other psoriatic arthropathy: Secondary | ICD-10-CM | POA: Diagnosis not present

## 2016-07-05 ENCOUNTER — Ambulatory Visit (INDEPENDENT_AMBULATORY_CARE_PROVIDER_SITE_OTHER): Payer: 59

## 2016-07-05 ENCOUNTER — Encounter: Payer: Self-pay | Admitting: Podiatry

## 2016-07-05 ENCOUNTER — Ambulatory Visit (INDEPENDENT_AMBULATORY_CARE_PROVIDER_SITE_OTHER): Payer: 59 | Admitting: Podiatry

## 2016-07-05 DIAGNOSIS — M79671 Pain in right foot: Secondary | ICD-10-CM

## 2016-07-05 DIAGNOSIS — M7752 Other enthesopathy of left foot: Secondary | ICD-10-CM

## 2016-07-05 DIAGNOSIS — M779 Enthesopathy, unspecified: Secondary | ICD-10-CM

## 2016-07-05 DIAGNOSIS — M79672 Pain in left foot: Secondary | ICD-10-CM

## 2016-07-05 DIAGNOSIS — M205X2 Other deformities of toe(s) (acquired), left foot: Secondary | ICD-10-CM

## 2016-07-05 MED ORDER — TRIAMCINOLONE ACETONIDE 10 MG/ML IJ SUSP
10.0000 mg | Freq: Once | INTRAMUSCULAR | Status: AC
  Administered 2016-07-05: 10 mg

## 2016-07-05 NOTE — Patient Instructions (Signed)
Hallux Rigidus Hallux rigidus is a type of joint pain or joint disease (arthritis) that affects your big toe (hallux). This condition involves the joint that connects the base of your big toe to the main part of your foot (metatarsophalangeal joint). This condition can cause your big toe to become stiff, painful, and difficult to move. Symptoms may get worse with movement or in cold or damp weather. The condition also gets worse over time. What are the causes? This condition may be caused by having a foot that does not function the way that it should or has an abnormal shape (structural deformity). These foot problems can run in families (be hereditary). This condition can also be caused by:  Injury.  Overuse.  Certain inflammatory diseases, including gout and rheumatoid arthritis. What increases the risk? This condition is more likely to develop in people who:  Have a foot bone (metatarsal) that is longer or higher than normal.  Have a family history of hallux rigidus.  Have previously injured their big toe.  Have feet that do not have a curve (arch) on the inner side of the foot. This may be called flat feet or fallen arches.  Turn their ankles in when they walk (pronation).  Have rheumatoid arthritis or gout.  Have to stoop down often at work. What are the signs or symptoms? Symptoms of this condition include:  Big toe pain.  Stiffness and difficulty moving the big toe.  Swelling of the toe and surrounding area.  Bone spurs. These are bony growths that can form on the joint of the big toe.  A limp. How is this diagnosed? This condition is diagnosed based on a medical history and physical exam. This may include X-rays. How is this treated? Treatment for this condition includes:  Wearing roomy, comfortable shoes that have a large toe box.  Putting orthotic devices in your shoes.  Pain medicines.  Physical therapy.  Icing the injured area.  Alternate between  putting your foot in cold water then warm water. If your condition is severe, treatment may include:  Corticosteroid injections to relieve pain.  Surgery to remove bone spurs, fuse damaged bones together, or replace the entire joint. Follow these instructions at home:  Take over-the-counter and prescription medicines only as told by your health care provider.  Do not wear high heels or other restrictive footwear. Wear comfortable, supportive shoes that have a large toe box.  Wear orthotics as told by your health care provider, if this applies.  Put your feet in cold water for 30 seconds, then in warm water for 30 seconds. Alternate between the cold and warm water for 5 minutes. Do this several times a day or as told by your health care provider.  If directed, apply ice to the injured area.  Put ice in a plastic bag.  Place a towel between your skin and the bag.  Leave the ice on for 20 minutes, 2-3 times per day.  Do foot exercises as instructed by your health care provider or a physical therapist.  Keep all follow-up visits as told by your health care provider. This is important. Contact a health care provider if:  You notice bone spurs or growths on or around your big toe.  Your pain does not get better or it gets worse.  You have pain while resting.  You have pain in other parts of your body, such as your back, hip, or knee.  You start to limp. This information is not intended   to replace advice given to you by your health care provider. Make sure you discuss any questions you have with your health care provider. Document Released: 04/24/2005 Document Revised: 09/30/2015 Document Reviewed: 12/30/2014 Elsevier Interactive Patient Education  2017 [employer]  

## 2016-07-05 NOTE — Progress Notes (Signed)
Subjective:     Patient ID: John Morse, male   DOB: April 28, 1956, 61 y.o.   MRN: [id number]  HPI patient states she is developing pain underneath his left big toe joint and on the side over the last 4-6 months and the area we did the surgery seems pretty good. Has orthotics but not sure as to whether they are relevant at this time   Review of Systems     Objective:   Physical Exam Neurovascular status intact with patient found to have discomfort more around the lateral side of the first MPJ left with mild reduction of motion of the joint with no crepitus noted and no discomfort in the second MPJ    Assessment:     Inflammatory hallux limitus deformity with fluid buildup around the lateral side of the joint surface which may be related to a functional condition    Plan:     H&P condition reviewed careful injection administered to the lateral side of the joint and he will bring his orthotic and it next visit for evaluation and possible change  X-ray report bilateral indicated screws in place second metatarsal left with some mild joint damage on the lateral side of the first MPJ bilateral

## 2016-07-12 DIAGNOSIS — H7203 Central perforation of tympanic membrane, bilateral: Secondary | ICD-10-CM | POA: Diagnosis not present

## 2016-07-12 DIAGNOSIS — H900 Conductive hearing loss, bilateral: Secondary | ICD-10-CM | POA: Diagnosis not present

## 2016-07-12 DIAGNOSIS — H6123 Impacted cerumen, bilateral: Secondary | ICD-10-CM | POA: Diagnosis not present

## 2016-07-13 DIAGNOSIS — Z0000 Encounter for general adult medical examination without abnormal findings: Secondary | ICD-10-CM | POA: Diagnosis not present

## 2016-07-13 DIAGNOSIS — R7301 Impaired fasting glucose: Secondary | ICD-10-CM | POA: Diagnosis not present

## 2016-07-13 DIAGNOSIS — I10 Essential (primary) hypertension: Secondary | ICD-10-CM | POA: Diagnosis not present

## 2016-07-20 DIAGNOSIS — E291 Testicular hypofunction: Secondary | ICD-10-CM | POA: Diagnosis not present

## 2016-07-24 DIAGNOSIS — E291 Testicular hypofunction: Secondary | ICD-10-CM | POA: Diagnosis not present

## 2016-07-31 ENCOUNTER — Ambulatory Visit (INDEPENDENT_AMBULATORY_CARE_PROVIDER_SITE_OTHER): Payer: 59 | Admitting: Podiatry

## 2016-07-31 ENCOUNTER — Encounter: Payer: Self-pay | Admitting: Podiatry

## 2016-07-31 DIAGNOSIS — M779 Enthesopathy, unspecified: Secondary | ICD-10-CM | POA: Diagnosis not present

## 2016-07-31 DIAGNOSIS — M205X2 Other deformities of toe(s) (acquired), left foot: Secondary | ICD-10-CM | POA: Diagnosis not present

## 2016-08-01 NOTE — Progress Notes (Signed)
Subjective:     Patient ID: John Morse, male   DOB: 12/12/1955, 61 y.o.   MRN: [id number]  HPI patient presents stating that he still having pain around his big toe joint and he just has trouble especially with biking   Review of Systems     Objective:   Physical Exam Neurovascular status intact muscle strength adequate with patient found to have discomfort still arise big toe joint left on the lateral side and mild discomfort second MPJ but not to the same degree with improvement from previous surgery    Assessment:     Inflammatory hallux limitus deformity left along with mild inflammation of the second MPJ    Plan:     H&P condition reviewed and at this time we discussed a orthotic with a Morton's-type extension to try to lift the first metatarsal. Patient is scanned by PET orthotist and will be seen back and we'll continue to wear rigid-type shoes

## 2016-08-17 DIAGNOSIS — R739 Hyperglycemia, unspecified: Secondary | ICD-10-CM | POA: Diagnosis not present

## 2016-08-21 ENCOUNTER — Other Ambulatory Visit: Payer: 59

## 2016-08-21 DIAGNOSIS — E291 Testicular hypofunction: Secondary | ICD-10-CM | POA: Diagnosis not present

## 2016-08-29 DIAGNOSIS — J209 Acute bronchitis, unspecified: Secondary | ICD-10-CM | POA: Diagnosis not present

## 2016-09-20 DIAGNOSIS — E782 Mixed hyperlipidemia: Secondary | ICD-10-CM | POA: Diagnosis not present

## 2016-09-20 DIAGNOSIS — Z23 Encounter for immunization: Secondary | ICD-10-CM | POA: Diagnosis not present

## 2016-09-20 DIAGNOSIS — I10 Essential (primary) hypertension: Secondary | ICD-10-CM | POA: Diagnosis not present

## 2016-09-20 DIAGNOSIS — E119 Type 2 diabetes mellitus without complications: Secondary | ICD-10-CM | POA: Diagnosis not present

## 2016-09-26 ENCOUNTER — Encounter: Payer: 59 | Attending: Family Medicine | Admitting: *Deleted

## 2016-09-26 DIAGNOSIS — E119 Type 2 diabetes mellitus without complications: Secondary | ICD-10-CM | POA: Insufficient documentation

## 2016-09-26 DIAGNOSIS — Z713 Dietary counseling and surveillance: Secondary | ICD-10-CM | POA: Diagnosis not present

## 2016-09-26 NOTE — Progress Notes (Signed)
Patient was seen on 09/26/2016 for the first of a series of three diabetes self-management courses at the [facility].  Patient Education Plan per assessed needs and concerns is to attend four course education program for Diabetes Self Management Education.  The following learning objectives were met by the patient during this class:  Describe diabetes  State some common risk factors for diabetes  Defines the role of glucose and insulin  Identifies type of diabetes and pathophysiology  Describe the relationship between diabetes and cardiovascular risk  State the members of the Healthcare Team  States the rationale for glucose monitoring  State when to test glucose  State their individual Target Range  State the importance of logging glucose readings  Describe how to interpret glucose readings  Identifies A1C target  Explain the correlation between A1c and eAG values  State symptoms and treatment of high blood glucose  State symptoms and treatment of low blood glucose  Explain proper technique for glucose testing  Identifies proper sharps disposal  Handouts given during class include:  Living Well with Diabetes book  Carb Counting and Meal Planning book  Meal Plan Card  Carbohydrate guide  Meal planning worksheet  Low Sodium Flavoring Tips  The diabetes portion plate  O1V to eAG Conversion Chart  Diabetes Medications  Diabetes Recommended Care Schedule  Support Group  Diabetes Success Plan  Core Class Satisfaction Survey  Follow-Up Plan:  Attend core 2

## 2016-10-03 ENCOUNTER — Encounter: Payer: 59 | Admitting: *Deleted

## 2016-10-03 DIAGNOSIS — E119 Type 2 diabetes mellitus without complications: Secondary | ICD-10-CM | POA: Diagnosis not present

## 2016-10-05 NOTE — Progress Notes (Signed)
Patient was seen on 10/03/2016 for the second of a series of three diabetes self-management courses at the [facility]. The following learning objectives were met by the patient during this class:   Describe the role of different macronutrients on glucose  Explain how carbohydrates affect blood glucose  State what foods contain the most carbohydrates  Demonstrate carbohydrate counting  Demonstrate how to read Nutrition Facts food label  Describe effects of various fats on heart health  Describe the importance of good nutrition for health and healthy eating strategies  Describe techniques for managing your shopping, cooking and meal planning  List strategies to follow meal plan when dining out  Describe the effects of alcohol on glucose and how to use it safely  Goals:  Follow Diabetes Meal Plan as instructed  Eat 3 meals and 2 snacks, every 3-5 hrs  Aim for carbohydrate intake of 60 grams carbohydrate/meal Aim for carbohydrate intake of 0-30 grams carbohydrate/snack Add lean protein foods to meals/snacks  Monitor glucose levels as instructed by your doctor   Follow-Up Plan:  Attend Core 3  Work towards following your personal food plan.

## 2016-10-10 ENCOUNTER — Encounter: Payer: 59 | Attending: Family Medicine | Admitting: Dietician

## 2016-10-10 ENCOUNTER — Ambulatory Visit: Payer: 59

## 2016-10-10 DIAGNOSIS — Z713 Dietary counseling and surveillance: Secondary | ICD-10-CM | POA: Diagnosis not present

## 2016-10-10 DIAGNOSIS — E119 Type 2 diabetes mellitus without complications: Secondary | ICD-10-CM

## 2016-10-10 NOTE — Progress Notes (Signed)
Patient was seen on 10/10/16 for the third of a series of three diabetes self-management courses at the [facility].   Catalina Gravel the amount of activity recommended for healthy living . Describe activities suitable for individual needs . Identify ways to regularly incorporate activity into daily life . Identify barriers to activity and ways to over come these barriers  Identify diabetes medications being personally used and their primary action for lowering glucose and possible side effects . Describe role of stress on blood glucose and develop strategies to address psychosocial issues . Identify diabetes complications and ways to prevent them  Explain how to manage diabetes during illness . Evaluate success in meeting personal goal . Establish 2-3 goals that they will plan to diligently work on until they return for the  56-month follow-up visit  Goals:   I will be active 60 minutes or more 3-4 times a week  I will test my glucose at least 1 times a day, 4 days a week  To help manage stress I will  Ride bike, go fishing, play guitar or do" nothing" at least 3 times a week  Your patient has identified these potential barriers to change:  Motivation Stress  Your patient has identified their diabetes self-care support plan as  Family Support On-line Resources Plan:  Attend Support Group as desired

## 2016-10-17 ENCOUNTER — Ambulatory Visit: Payer: 59

## 2016-10-24 DIAGNOSIS — E291 Testicular hypofunction: Secondary | ICD-10-CM | POA: Diagnosis not present

## 2016-11-21 DIAGNOSIS — E291 Testicular hypofunction: Secondary | ICD-10-CM | POA: Diagnosis not present

## 2016-12-18 DIAGNOSIS — M25559 Pain in unspecified hip: Secondary | ICD-10-CM | POA: Diagnosis not present

## 2016-12-18 DIAGNOSIS — Z79899 Other long term (current) drug therapy: Secondary | ICD-10-CM | POA: Diagnosis not present

## 2016-12-18 DIAGNOSIS — L4059 Other psoriatic arthropathy: Secondary | ICD-10-CM | POA: Diagnosis not present

## 2016-12-19 DIAGNOSIS — E291 Testicular hypofunction: Secondary | ICD-10-CM | POA: Diagnosis not present

## 2016-12-26 DIAGNOSIS — H7203 Central perforation of tympanic membrane, bilateral: Secondary | ICD-10-CM | POA: Diagnosis not present

## 2016-12-26 DIAGNOSIS — H66012 Acute suppurative otitis media with spontaneous rupture of ear drum, left ear: Secondary | ICD-10-CM | POA: Diagnosis not present

## 2017-01-12 DIAGNOSIS — E782 Mixed hyperlipidemia: Secondary | ICD-10-CM | POA: Diagnosis not present

## 2017-01-12 DIAGNOSIS — E291 Testicular hypofunction: Secondary | ICD-10-CM | POA: Diagnosis not present

## 2017-01-12 DIAGNOSIS — I10 Essential (primary) hypertension: Secondary | ICD-10-CM | POA: Diagnosis not present

## 2017-01-12 DIAGNOSIS — D649 Anemia, unspecified: Secondary | ICD-10-CM | POA: Diagnosis not present

## 2017-01-12 DIAGNOSIS — E1165 Type 2 diabetes mellitus with hyperglycemia: Secondary | ICD-10-CM | POA: Diagnosis not present

## 2017-01-16 DIAGNOSIS — H66012 Acute suppurative otitis media with spontaneous rupture of ear drum, left ear: Secondary | ICD-10-CM | POA: Diagnosis not present

## 2017-02-12 DIAGNOSIS — E291 Testicular hypofunction: Secondary | ICD-10-CM | POA: Diagnosis not present

## 2017-03-12 DIAGNOSIS — E291 Testicular hypofunction: Secondary | ICD-10-CM | POA: Diagnosis not present

## 2017-04-10 DIAGNOSIS — E291 Testicular hypofunction: Secondary | ICD-10-CM | POA: Diagnosis not present

## 2017-04-10 DIAGNOSIS — Z86010 Personal history of colonic polyps: Secondary | ICD-10-CM | POA: Diagnosis not present

## 2017-04-10 DIAGNOSIS — D509 Iron deficiency anemia, unspecified: Secondary | ICD-10-CM | POA: Diagnosis not present

## 2017-04-17 DIAGNOSIS — H6123 Impacted cerumen, bilateral: Secondary | ICD-10-CM | POA: Diagnosis not present

## 2017-04-17 DIAGNOSIS — H7203 Central perforation of tympanic membrane, bilateral: Secondary | ICD-10-CM | POA: Diagnosis not present

## 2017-04-27 DIAGNOSIS — D509 Iron deficiency anemia, unspecified: Secondary | ICD-10-CM | POA: Diagnosis not present

## 2017-04-27 DIAGNOSIS — D126 Benign neoplasm of colon, unspecified: Secondary | ICD-10-CM | POA: Diagnosis not present

## 2017-04-27 DIAGNOSIS — K2981 Duodenitis with bleeding: Secondary | ICD-10-CM | POA: Diagnosis not present

## 2017-05-02 DIAGNOSIS — H7203 Central perforation of tympanic membrane, bilateral: Secondary | ICD-10-CM | POA: Diagnosis not present

## 2017-05-02 DIAGNOSIS — D509 Iron deficiency anemia, unspecified: Secondary | ICD-10-CM | POA: Diagnosis not present

## 2017-05-02 DIAGNOSIS — H66012 Acute suppurative otitis media with spontaneous rupture of ear drum, left ear: Secondary | ICD-10-CM | POA: Diagnosis not present

## 2017-05-11 DIAGNOSIS — E291 Testicular hypofunction: Secondary | ICD-10-CM | POA: Diagnosis not present

## 2017-06-12 DIAGNOSIS — E291 Testicular hypofunction: Secondary | ICD-10-CM | POA: Diagnosis not present

## 2017-06-13 DIAGNOSIS — D509 Iron deficiency anemia, unspecified: Secondary | ICD-10-CM | POA: Diagnosis not present

## 2017-06-13 DIAGNOSIS — R195 Other fecal abnormalities: Secondary | ICD-10-CM | POA: Diagnosis not present

## 2017-06-20 DIAGNOSIS — L4059 Other psoriatic arthropathy: Secondary | ICD-10-CM | POA: Diagnosis not present

## 2017-06-20 DIAGNOSIS — Z79899 Other long term (current) drug therapy: Secondary | ICD-10-CM | POA: Diagnosis not present

## 2017-06-20 DIAGNOSIS — M79643 Pain in unspecified hand: Secondary | ICD-10-CM | POA: Diagnosis not present

## 2017-06-28 DIAGNOSIS — D509 Iron deficiency anemia, unspecified: Secondary | ICD-10-CM | POA: Diagnosis not present

## 2017-07-09 DIAGNOSIS — E119 Type 2 diabetes mellitus without complications: Secondary | ICD-10-CM | POA: Diagnosis not present

## 2017-07-10 DIAGNOSIS — E291 Testicular hypofunction: Secondary | ICD-10-CM | POA: Diagnosis not present

## 2017-07-31 DIAGNOSIS — Z0000 Encounter for general adult medical examination without abnormal findings: Secondary | ICD-10-CM | POA: Diagnosis not present

## 2017-07-31 DIAGNOSIS — Z125 Encounter for screening for malignant neoplasm of prostate: Secondary | ICD-10-CM | POA: Diagnosis not present

## 2017-07-31 DIAGNOSIS — E119 Type 2 diabetes mellitus without complications: Secondary | ICD-10-CM | POA: Diagnosis not present

## 2017-07-31 DIAGNOSIS — E291 Testicular hypofunction: Secondary | ICD-10-CM | POA: Diagnosis not present

## 2017-08-01 DIAGNOSIS — H60332 Swimmer's ear, left ear: Secondary | ICD-10-CM | POA: Diagnosis not present

## 2017-08-14 DIAGNOSIS — H7203 Central perforation of tympanic membrane, bilateral: Secondary | ICD-10-CM | POA: Diagnosis not present

## 2017-08-17 DIAGNOSIS — E291 Testicular hypofunction: Secondary | ICD-10-CM | POA: Diagnosis not present

## 2017-08-17 DIAGNOSIS — R945 Abnormal results of liver function studies: Secondary | ICD-10-CM | POA: Diagnosis not present

## 2017-08-21 ENCOUNTER — Other Ambulatory Visit: Payer: Self-pay | Admitting: Family Medicine

## 2017-08-21 DIAGNOSIS — R7989 Other specified abnormal findings of blood chemistry: Secondary | ICD-10-CM

## 2017-08-21 DIAGNOSIS — R945 Abnormal results of liver function studies: Secondary | ICD-10-CM

## 2017-08-31 ENCOUNTER — Ambulatory Visit
Admission: RE | Admit: 2017-08-31 | Discharge: 2017-08-31 | Disposition: A | Discharge status: Home / Self Care | Payer: 59 | Source: Ambulatory Visit | Attending: Family Medicine | Admitting: Family Medicine

## 2017-08-31 DIAGNOSIS — K760 Fatty (change of) liver, not elsewhere classified: Secondary | ICD-10-CM | POA: Diagnosis not present

## 2017-08-31 DIAGNOSIS — R945 Abnormal results of liver function studies: Secondary | ICD-10-CM

## 2017-08-31 DIAGNOSIS — R7989 Other specified abnormal findings of blood chemistry: Secondary | ICD-10-CM

## 2017-09-14 DIAGNOSIS — E291 Testicular hypofunction: Secondary | ICD-10-CM | POA: Diagnosis not present

## 2017-09-14 DIAGNOSIS — R945 Abnormal results of liver function studies: Secondary | ICD-10-CM | POA: Diagnosis not present

## 2017-09-21 DIAGNOSIS — M25579 Pain in unspecified ankle and joints of unspecified foot: Secondary | ICD-10-CM | POA: Diagnosis not present

## 2017-09-21 DIAGNOSIS — Z79899 Other long term (current) drug therapy: Secondary | ICD-10-CM | POA: Diagnosis not present

## 2017-09-21 DIAGNOSIS — M25572 Pain in left ankle and joints of left foot: Secondary | ICD-10-CM | POA: Diagnosis not present

## 2017-09-21 DIAGNOSIS — M19071 Primary osteoarthritis, right ankle and foot: Secondary | ICD-10-CM | POA: Diagnosis not present

## 2017-09-21 DIAGNOSIS — M79672 Pain in left foot: Secondary | ICD-10-CM | POA: Diagnosis not present

## 2017-09-21 DIAGNOSIS — L4059 Other psoriatic arthropathy: Secondary | ICD-10-CM | POA: Diagnosis not present

## 2017-10-12 DIAGNOSIS — E291 Testicular hypofunction: Secondary | ICD-10-CM | POA: Diagnosis not present

## 2017-11-05 DIAGNOSIS — E291 Testicular hypofunction: Secondary | ICD-10-CM | POA: Diagnosis not present

## 2017-11-05 DIAGNOSIS — E1169 Type 2 diabetes mellitus with other specified complication: Secondary | ICD-10-CM | POA: Diagnosis not present

## 2017-11-05 DIAGNOSIS — E782 Mixed hyperlipidemia: Secondary | ICD-10-CM | POA: Diagnosis not present

## 2017-11-05 DIAGNOSIS — I10 Essential (primary) hypertension: Secondary | ICD-10-CM | POA: Diagnosis not present

## 2017-12-06 DIAGNOSIS — E291 Testicular hypofunction: Secondary | ICD-10-CM | POA: Diagnosis not present

## 2018-01-03 DIAGNOSIS — E291 Testicular hypofunction: Secondary | ICD-10-CM | POA: Diagnosis not present

## 2018-01-03 DIAGNOSIS — Z79899 Other long term (current) drug therapy: Secondary | ICD-10-CM | POA: Diagnosis not present

## 2018-01-03 DIAGNOSIS — L4059 Other psoriatic arthropathy: Secondary | ICD-10-CM | POA: Diagnosis not present

## 2018-01-03 DIAGNOSIS — M25579 Pain in unspecified ankle and joints of unspecified foot: Secondary | ICD-10-CM | POA: Diagnosis not present

## 2018-01-29 DIAGNOSIS — Z23 Encounter for immunization: Secondary | ICD-10-CM | POA: Diagnosis not present

## 2018-02-07 DIAGNOSIS — E291 Testicular hypofunction: Secondary | ICD-10-CM | POA: Diagnosis not present

## 2018-02-19 DIAGNOSIS — H7203 Central perforation of tympanic membrane, bilateral: Secondary | ICD-10-CM | POA: Diagnosis not present

## 2018-02-19 DIAGNOSIS — H6123 Impacted cerumen, bilateral: Secondary | ICD-10-CM | POA: Diagnosis not present

## 2018-03-11 DIAGNOSIS — E782 Mixed hyperlipidemia: Secondary | ICD-10-CM | POA: Diagnosis not present

## 2018-03-11 DIAGNOSIS — E1169 Type 2 diabetes mellitus with other specified complication: Secondary | ICD-10-CM | POA: Diagnosis not present

## 2018-03-11 DIAGNOSIS — I10 Essential (primary) hypertension: Secondary | ICD-10-CM | POA: Diagnosis not present

## 2018-03-11 DIAGNOSIS — E291 Testicular hypofunction: Secondary | ICD-10-CM | POA: Diagnosis not present

## 2018-04-11 DIAGNOSIS — E291 Testicular hypofunction: Secondary | ICD-10-CM | POA: Diagnosis not present

## 2018-05-15 DIAGNOSIS — E291 Testicular hypofunction: Secondary | ICD-10-CM | POA: Diagnosis not present

## 2018-06-13 DIAGNOSIS — E1169 Type 2 diabetes mellitus with other specified complication: Secondary | ICD-10-CM | POA: Diagnosis not present

## 2018-06-13 DIAGNOSIS — E291 Testicular hypofunction: Secondary | ICD-10-CM | POA: Diagnosis not present

## 2018-07-04 DIAGNOSIS — E291 Testicular hypofunction: Secondary | ICD-10-CM | POA: Diagnosis not present

## 2018-08-01 DIAGNOSIS — E291 Testicular hypofunction: Secondary | ICD-10-CM | POA: Diagnosis not present

## 2018-08-26 DIAGNOSIS — L4059 Other psoriatic arthropathy: Secondary | ICD-10-CM | POA: Diagnosis not present

## 2018-08-26 DIAGNOSIS — M1990 Unspecified osteoarthritis, unspecified site: Secondary | ICD-10-CM | POA: Diagnosis not present

## 2018-08-26 DIAGNOSIS — M25579 Pain in unspecified ankle and joints of unspecified foot: Secondary | ICD-10-CM | POA: Diagnosis not present

## 2018-08-26 DIAGNOSIS — Z79899 Other long term (current) drug therapy: Secondary | ICD-10-CM | POA: Diagnosis not present

## 2018-09-02 DIAGNOSIS — E291 Testicular hypofunction: Secondary | ICD-10-CM | POA: Diagnosis not present

## 2018-10-01 DIAGNOSIS — E291 Testicular hypofunction: Secondary | ICD-10-CM | POA: Diagnosis not present

## 2018-11-01 DIAGNOSIS — E291 Testicular hypofunction: Secondary | ICD-10-CM | POA: Diagnosis not present

## 2018-11-06 DIAGNOSIS — K5792 Diverticulitis of intestine, part unspecified, without perforation or abscess without bleeding: Secondary | ICD-10-CM

## 2018-11-06 HISTORY — DX: Diverticulitis of intestine, part unspecified, without perforation or abscess without bleeding: K57.92

## 2018-11-07 ENCOUNTER — Inpatient Hospital Stay (HOSPITAL_COMMUNITY)
Admission: EM | Admit: 2018-11-07 | Discharge: 2018-11-12 | DRG: 392 | Disposition: A | Discharge status: Home / Self Care | Payer: BC Managed Care – PPO | Attending: Physician Assistant | Admitting: Physician Assistant

## 2018-11-07 ENCOUNTER — Emergency Department (HOSPITAL_COMMUNITY): Payer: BC Managed Care – PPO

## 2018-11-07 ENCOUNTER — Other Ambulatory Visit: Payer: Self-pay

## 2018-11-07 ENCOUNTER — Encounter (HOSPITAL_COMMUNITY): Payer: Self-pay | Admitting: Emergency Medicine

## 2018-11-07 DIAGNOSIS — L4050 Arthropathic psoriasis, unspecified: Secondary | ICD-10-CM | POA: Diagnosis not present

## 2018-11-07 DIAGNOSIS — Z79899 Other long term (current) drug therapy: Secondary | ICD-10-CM

## 2018-11-07 DIAGNOSIS — K5720 Diverticulitis of large intestine with perforation and abscess without bleeding: Secondary | ICD-10-CM

## 2018-11-07 DIAGNOSIS — E119 Type 2 diabetes mellitus without complications: Secondary | ICD-10-CM | POA: Diagnosis present

## 2018-11-07 DIAGNOSIS — I10 Essential (primary) hypertension: Secondary | ICD-10-CM | POA: Diagnosis present

## 2018-11-07 DIAGNOSIS — E785 Hyperlipidemia, unspecified: Secondary | ICD-10-CM | POA: Diagnosis present

## 2018-11-07 DIAGNOSIS — Z7982 Long term (current) use of aspirin: Secondary | ICD-10-CM

## 2018-11-07 DIAGNOSIS — R509 Fever, unspecified: Secondary | ICD-10-CM | POA: Diagnosis not present

## 2018-11-07 DIAGNOSIS — Z1159 Encounter for screening for other viral diseases: Secondary | ICD-10-CM

## 2018-11-07 DIAGNOSIS — Z87891 Personal history of nicotine dependence: Secondary | ICD-10-CM

## 2018-11-07 DIAGNOSIS — M1990 Unspecified osteoarthritis, unspecified site: Secondary | ICD-10-CM | POA: Diagnosis not present

## 2018-11-07 DIAGNOSIS — Z20828 Contact with and (suspected) exposure to other viral communicable diseases: Secondary | ICD-10-CM | POA: Diagnosis not present

## 2018-11-07 DIAGNOSIS — R109 Unspecified abdominal pain: Secondary | ICD-10-CM | POA: Diagnosis not present

## 2018-11-07 DIAGNOSIS — R1032 Left lower quadrant pain: Secondary | ICD-10-CM | POA: Diagnosis not present

## 2018-11-07 HISTORY — DX: Prediabetes: R73.03

## 2018-11-07 HISTORY — DX: Diverticulitis of large intestine with perforation and abscess without bleeding: K57.20

## 2018-11-07 LAB — CBC WITH DIFFERENTIAL/PLATELET
Abs Immature Granulocytes: 0.06 10*3/uL (ref 0.00–0.07)
Basophils Absolute: 0.1 10*3/uL (ref 0.0–0.1)
Basophils Relative: 0 %
Eosinophils Absolute: 0 10*3/uL (ref 0.0–0.5)
Eosinophils Relative: 0 %
HCT: 49.1 % (ref 39.0–52.0)
Hemoglobin: 17.3 g/dL — ABNORMAL HIGH (ref 13.0–17.0)
Immature Granulocytes: 0 %
Lymphocytes Relative: 12 %
Lymphs Abs: 2 10*3/uL (ref 0.7–4.0)
MCH: 30.4 pg (ref 26.0–34.0)
MCHC: 35.2 g/dL (ref 30.0–36.0)
MCV: 86.1 fL (ref 80.0–100.0)
Monocytes Absolute: 1.8 10*3/uL — ABNORMAL HIGH (ref 0.1–1.0)
Monocytes Relative: 10 %
Neutro Abs: 13 10*3/uL — ABNORMAL HIGH (ref 1.7–7.7)
Neutrophils Relative %: 78 %
Platelets: 164 10*3/uL (ref 150–400)
RBC: 5.7 MIL/uL (ref 4.22–5.81)
RDW: 13.6 % (ref 11.5–15.5)
WBC: 17 10*3/uL — ABNORMAL HIGH (ref 4.0–10.5)
nRBC: 0 % (ref 0.0–0.2)

## 2018-11-07 LAB — LIPASE, BLOOD: Lipase: 25 U/L (ref 11–51)

## 2018-11-07 LAB — COMPREHENSIVE METABOLIC PANEL
ALT: 30 U/L (ref 0–44)
AST: 23 U/L (ref 15–41)
Albumin: 4.1 g/dL (ref 3.5–5.0)
Alkaline Phosphatase: 41 U/L (ref 38–126)
Anion gap: 9 (ref 5–15)
BUN: 12 mg/dL (ref 6–20)
CO2: 28 mmol/L (ref 22–32)
Calcium: 9.4 mg/dL (ref 8.9–10.3)
Chloride: 99 mmol/L (ref 98–111)
Creatinine, Ser: 1.15 mg/dL (ref 0.61–1.24)
GFR calc Af Amer: >60 mL/min (ref >60)
GFR calc non Af Amer: >60 mL/min (ref >60)
Glucose, Bld: 142 mg/dL — ABNORMAL HIGH (ref 70–99)
Potassium: 3.4 mmol/L — ABNORMAL LOW (ref 3.5–5.1)
Sodium: 136 mmol/L (ref 135–145)
Total Bilirubin: 1.7 mg/dL — ABNORMAL HIGH (ref 0.3–1.2)
Total Protein: 7 g/dL (ref 6.5–8.1)

## 2018-11-07 LAB — URINALYSIS, ROUTINE W REFLEX MICROSCOPIC
Bilirubin Urine: NEGATIVE
Glucose, UA: NEGATIVE mg/dL
Hgb urine dipstick: NEGATIVE
Ketones, ur: NEGATIVE mg/dL
Leukocytes,Ua: NEGATIVE
Nitrite: NEGATIVE
Protein, ur: NEGATIVE mg/dL
Specific Gravity, Urine: 1.032 — ABNORMAL HIGH (ref 1.005–1.030)
pH: 7 (ref 5.0–8.0)

## 2018-11-07 LAB — CREATININE, SERUM
Creatinine, Ser: 1.19 mg/dL (ref 0.61–1.24)
GFR calc Af Amer: >60 mL/min (ref >60)
GFR calc non Af Amer: >60 mL/min (ref >60)

## 2018-11-07 LAB — SARS CORONAVIRUS 2 BY RT PCR (HOSPITAL ORDER, PERFORMED IN ~~LOC~~ HOSPITAL LAB): SARS Coronavirus 2: NEGATIVE

## 2018-11-07 LAB — LACTIC ACID, PLASMA: Lactic Acid, Venous: 0.9 mmol/L (ref 0.5–1.9)

## 2018-11-07 MED ORDER — POTASSIUM CHLORIDE IN NACL 20-0.45 MEQ/L-% IV SOLN
INTRAVENOUS | Status: DC
  Administered 2018-11-07 – 2018-11-11 (×13): via INTRAVENOUS
  Filled 2018-11-07 (×15): qty 1000

## 2018-11-07 MED ORDER — METHOCARBAMOL 500 MG PO TABS: 500.0000 mg | ORAL_TABLET | Freq: Four times a day (QID) | ORAL | Status: DC | PRN

## 2018-11-07 MED ORDER — ENOXAPARIN SODIUM 40 MG/0.4ML ~~LOC~~ SOLN
40.0000 mg | SUBCUTANEOUS | Status: DC
  Administered 2018-11-07 – 2018-11-12 (×6): 40 mg via SUBCUTANEOUS
  Filled 2018-11-07 (×6): qty 0.4

## 2018-11-07 MED ORDER — PANTOPRAZOLE SODIUM 40 MG IV SOLR
40.0000 mg | Freq: Every day | INTRAVENOUS | Status: DC
  Administered 2018-11-07 – 2018-11-11 (×5): 40 mg via INTRAVENOUS
  Filled 2018-11-07 (×5): qty 40

## 2018-11-07 MED ORDER — ONDANSETRON 4 MG PO TBDP: 4.0000 mg | ORAL_TABLET | Freq: Four times a day (QID) | ORAL | Status: DC | PRN

## 2018-11-07 MED ORDER — DIPHENHYDRAMINE HCL 50 MG/ML IJ SOLN: 12.5000 mg | Freq: Four times a day (QID) | INTRAMUSCULAR | Status: DC | PRN

## 2018-11-07 MED ORDER — PIPERACILLIN-TAZOBACTAM 3.375 G IVPB
3.3750 g | Freq: Three times a day (TID) | INTRAVENOUS | Status: DC
  Administered 2018-11-07 – 2018-11-11 (×12): 3.375 g via INTRAVENOUS
  Filled 2018-11-07 (×12): qty 50

## 2018-11-07 MED ORDER — SIMETHICONE 80 MG PO CHEW: 40.0000 mg | CHEWABLE_TABLET | Freq: Four times a day (QID) | ORAL | Status: DC | PRN

## 2018-11-07 MED ORDER — MORPHINE SULFATE (PF) 4 MG/ML IV SOLN
4.0000 mg | Freq: Once | INTRAVENOUS | Status: AC
  Administered 2018-11-07: 4 mg via INTRAVENOUS
  Filled 2018-11-07: qty 1

## 2018-11-07 MED ORDER — METOPROLOL TARTRATE 5 MG/5ML IV SOLN: 5.0000 mg | Freq: Four times a day (QID) | INTRAVENOUS | Status: DC | PRN

## 2018-11-07 MED ORDER — ONDANSETRON HCL 4 MG/2ML IJ SOLN
4.0000 mg | Freq: Once | INTRAMUSCULAR | Status: AC
  Administered 2018-11-07: 09:00:00 4 mg via INTRAVENOUS
  Filled 2018-11-07: qty 2

## 2018-11-07 MED ORDER — ONDANSETRON HCL 4 MG/2ML IJ SOLN: 4.0000 mg | Freq: Four times a day (QID) | INTRAMUSCULAR | Status: DC | PRN

## 2018-11-07 MED ORDER — PIPERACILLIN-TAZOBACTAM 3.375 G IVPB 30 MIN
3.3750 g | Freq: Once | INTRAVENOUS | Status: AC
  Administered 2018-11-07: 3.375 g via INTRAVENOUS
  Filled 2018-11-07: qty 50

## 2018-11-07 MED ORDER — POLYETHYLENE GLYCOL 3350 17 G PO PACK: 17.0000 g | PACK | Freq: Every day | ORAL | Status: DC | PRN

## 2018-11-07 MED ORDER — DIPHENHYDRAMINE HCL 12.5 MG/5ML PO ELIX: 12.5000 mg | ORAL_SOLUTION | Freq: Four times a day (QID) | ORAL | Status: DC | PRN

## 2018-11-07 MED ORDER — OXYCODONE HCL 5 MG PO TABS
5.0000 mg | ORAL_TABLET | ORAL | Status: DC | PRN
  Administered 2018-11-07: 5 mg via ORAL
  Filled 2018-11-07: qty 1

## 2018-11-07 MED ORDER — IOPAMIDOL (ISOVUE-300) INJECTION 61%
100.0000 mL | Freq: Once | INTRAVENOUS | Status: AC | PRN
  Administered 2018-11-07: 100 mL via INTRAVENOUS

## 2018-11-07 MED ORDER — MORPHINE SULFATE (PF) 2 MG/ML IV SOLN: 2.0000 mg | INTRAVENOUS | Status: DC | PRN

## 2018-11-07 MED ORDER — ACETAMINOPHEN 650 MG RE SUPP: 650.0000 mg | Freq: Four times a day (QID) | RECTAL | Status: DC | PRN

## 2018-11-07 MED ORDER — SODIUM CHLORIDE 0.9 % IV BOLUS
1000.0000 mL | Freq: Once | INTRAVENOUS | Status: AC
  Administered 2018-11-07: 1000 mL via INTRAVENOUS

## 2018-11-07 MED ORDER — ACETAMINOPHEN 325 MG PO TABS
650.0000 mg | ORAL_TABLET | Freq: Four times a day (QID) | ORAL | Status: DC | PRN
  Administered 2018-11-07 – 2018-11-09 (×10): 650 mg via ORAL
  Filled 2018-11-07 (×11): qty 2

## 2018-11-07 NOTE — ED Notes (Signed)
Pts wife- Rei Medlen [phone] Will be waiting in her car

## 2018-11-07 NOTE — ED Provider Notes (Signed)
[facility] EMERGENCY DEPARTMENT Provider Note   CSN: [id number] Arrival date & time: 11/07/18  4098    History   Chief Complaint Chief Complaint  Patient presents with  . Abdominal Pain  . Fever    HPI John Morse is a 63 y.o. male.     John Morse is a 63 y.o. male with a history of hypertension, hyperlipidemia, diet-controlled diabetes, arthritis, who presents to the emergency department for evaluation of lower abdominal pain.  He reports pain began yesterday after lunch.  He reports he ate a salad and then had gradual onset of worsening pain across his lower abdomen.  He reports low-grade fever of 99 when pain started.  He took ibuprofen which helped with the fever but not the pain.  He describes it as a constant severe ache that persisted throughout the night and worsened this morning.  He reports he had a salad for lunch which is not out of the ordinary for him.  He reports he was up several times during the night with frequent urination and hot and cold episodes but has been constipated since pain began.  No blood in the stools previously.  He has not had any return of fevers.  He reports nausea and decreased appetite but no vomiting.  No dysuria or urinary frequency.  No associated chest pain, shortness of breath or cough.  He denies prior history of abdominal surgeries or similar symptoms in the past.  He has not had anything aside from ibuprofen for pain prior to arrival, no aggravating or alleviating factors.     Past Medical History:  Diagnosis Date  . Arthritis   . Carpal tunnel syndrome    rt  . Complication of anesthesia    vomiting  . Hyperlipemia   . Hypertension   . PONV (postoperative nausea and vomiting)     There are no active problems to display for this patient.   Past Surgical History:  Procedure Laterality Date  . ARTHROSCOPIC REPAIR ACL     rt knee  . CARPAL TUNNEL RELEASE     lt  . CARPAL TUNNEL RELEASE  05/25/2011   Procedure: CARPAL TUNNEL RELEASE;  Surgeon: Cammie Sickle., MD;  Location: [facility];  Service: Orthopedics;  Laterality: Right;  . CERVICAL FUSION    . SHOULDER ARTHROSCOPY W/ ROTATOR CUFF REPAIR     rightx3  . SHOULDER ARTHROSCOPY W/ ROTATOR CUFF REPAIR     left   . TONSILLECTOMY          Home Medications    Prior to Admission medications   Medication Sig Start Date End Date Taking? Authorizing Provider  Ascorbic Acid (VITAMIN C) 1000 MG tablet Take 1,000 mg by mouth daily.   Yes [provider]  aspirin 81 MG chewable tablet Chew 81 mg by mouth daily.   Yes [provider]  atenolol-chlorthalidone (TENORETIC) 50-25 MG per tablet Take 1 tablet by mouth daily.   Yes [provider]  cetirizine (ZYRTEC) 10 MG tablet Take 10 mg by mouth daily. 08/19/18  Yes [provider]  COSENTYX SENSOREADY, 300 MG, 150 MG/ML SOAJ Inject 300 mg as directed every 30 (thirty) days. (2) 150 mg injections 10/14/18  Yes [provider]  ferrous sulfate 324 MG TBEC Take 324 mg by mouth daily with breakfast.   Yes [provider]  folic acid (FOLVITE) 119 MCG tablet Take 400 mcg by mouth daily.   Yes [provider]  ipratropium (ATROVENT) 0.06 % nasal spray Place 2 sprays into both nostrils as needed for rhinitis.   Yes [provider]  Omega-3 Fatty Acids (FISH OIL PO) Take by mouth.   Yes [provider]  pravastatin (PRAVACHOL) 40 MG tablet Take 40 mg by mouth daily.   Yes [provider]  vitamin E 400 UNIT capsule Take 400 Units by mouth daily.   Yes [provider]    Family History No family history on file.  Social History Social History   Tobacco Use  . Smoking status: Former Smoker    Quit date: 05/22/1997    Years since quitting: 21.4  . Smokeless tobacco: Never Used  Substance Use Topics  . Alcohol use: Yes    Comment: occ  . Drug use: No     Allergies   Other,  Avapro [irbesartan], Codeine, Lisinopril, and Otezla [apremilast]   Review of Systems Review of Systems  Constitutional: Positive for chills. Negative for fever.  HENT: Negative.   Respiratory: Negative for cough and shortness of breath.   Cardiovascular: Negative for chest pain.  Gastrointestinal: Positive for abdominal pain, constipation and nausea. Negative for blood in stool and vomiting.  Genitourinary: Negative for dysuria and frequency.  Musculoskeletal: Negative for arthralgias and myalgias.  Skin: Negative for color change and rash.  Neurological: Negative for dizziness, syncope and numbness.     Physical Exam Updated Vital Signs BP (!) 148/111 (BP Location: Right Arm)   Pulse 90   Temp 98.4 F (36.9 C) (Oral)   Resp 16   Ht 6' (1.829 m)   Wt 106.6 kg   SpO2 96%   BMI 31.87 kg/m   Physical Exam Vitals signs and nursing note reviewed.  Constitutional:      General: He is not in acute distress.    Appearance: He is well-developed and normal weight. He is not ill-appearing or diaphoretic.     Comments: Well-appearing  HENT:     Head: Normocephalic and atraumatic.     Mouth/Throat:     Mouth: Mucous membranes are moist.     Pharynx: Oropharynx is clear.  Eyes:     General:        Right eye: No discharge.        Left eye: No discharge.     Pupils: Pupils are equal, round, and reactive to light.  Neck:     Musculoskeletal: Neck supple.  Cardiovascular:     Rate and Rhythm: Normal rate and regular rhythm.     Heart sounds: Normal heart sounds. No murmur. No friction rub. No gallop.   Pulmonary:     Effort: Pulmonary effort is normal. No respiratory distress.     Breath sounds: Normal breath sounds. No wheezing or rales.     Comments: Respirations equal and unlabored, patient able to speak in full sentences, lungs clear to auscultation bilaterally Abdominal:     General: Bowel sounds are normal. There is no distension.     Palpations: Abdomen is soft. There  is no mass.     Tenderness: There is abdominal tenderness in the suprapubic area and left lower quadrant. There is guarding.     Comments: Abdomen is soft and nondistended, bowel sounds present throughout, there is focal tenderness in the left lower quadrant and suprapubic region with some guarding, no rebound tenderness, all other quadrants nontender to palpation, no CVA tenderness.  Musculoskeletal:        General: No deformity.  Skin:  General: Skin is warm and dry.     Capillary Refill: Capillary refill takes less than 2 seconds.  Neurological:     Mental Status: He is alert and oriented to person, place, and time.     Coordination: Coordination normal.     Comments: Speech is clear, able to follow commands Moves extremities without ataxia, coordination intact  Psychiatric:        Mood and Affect: Mood normal.        Behavior: Behavior normal.      ED Treatments / Results  Labs (all labs ordered are listed, but only abnormal results are displayed) Labs Reviewed  COMPREHENSIVE METABOLIC PANEL - Abnormal; Notable for the following components:      Result Value   Potassium 3.4 (*)    Glucose, Bld 142 (*)    Total Bilirubin 1.7 (*)    All other components within normal limits  CBC WITH DIFFERENTIAL/PLATELET - Abnormal; Notable for the following components:   WBC 17.0 (*)    Hemoglobin 17.3 (*)    Neutro Abs 13.0 (*)    Monocytes Absolute 1.8 (*)    All other components within normal limits  SARS CORONAVIRUS 2 (HOSPITAL ORDER, [facility])  LIPASE, BLOOD  LACTIC ACID, PLASMA  URINALYSIS, ROUTINE W REFLEX MICROSCOPIC    EKG None  Radiology Ct Abdomen Pelvis W Contrast  Result Date: 11/07/2018 CLINICAL DATA:  Lower abdominal pain EXAM: CT ABDOMEN AND PELVIS WITH CONTRAST TECHNIQUE: Multidetector CT imaging of the abdomen and pelvis was performed using the standard protocol following bolus administration of intravenous contrast. CONTRAST:  180mL  ISOVUE-300 IOPAMIDOL (ISOVUE-300) INJECTION 61% COMPARISON:  None. FINDINGS: Lower chest: No acute abnormality. Hepatobiliary: Mild fatty infiltration of the liver is noted. The gallbladder is within normal limits. Pancreas: Unremarkable. No pancreatic ductal dilatation or surrounding inflammatory changes. Spleen: Normal in size without focal abnormality. Adrenals/Urinary Tract: Adrenal glands are within normal limits. Kidneys are well visualized bilaterally without renal calculi or obstructive changes. Right renal cyst is seen. Bladder is partially decompressed. Stomach/Bowel: There are changes consistent with diverticulitis in the sigmoid colon with a moderate amount of extraluminal air which tracks superiorly along the vascular pedicle. No definitive abscess is noted at this time. The more proximal colon is within normal limits. The appendix is unremarkable. The stomach and small bowel are within normal limits. Vascular/Lymphatic: Aortic atherosclerosis. No enlarged abdominal or pelvic lymph nodes. Reproductive: Prostate is unremarkable. Other: No abdominal wall hernia or abnormality. No abdominopelvic ascites. Musculoskeletal: No acute or significant osseous findings. IMPRESSION: Changes consistent with diverticulitis in the sigmoid colon with likely diverticular rupture and air extending along the vascular pedicle of mesentery. Electronically Signed   By: Inez Catalina M.D.   On: 11/07/2018 09:49    Procedures .Critical Care Performed by: Jacqlyn Larsen, PA-C Authorized by: Jacqlyn Larsen, PA-C   Critical care provider statement:    Critical care time (minutes):  45   Critical care was necessary to treat or prevent imminent or life-threatening deterioration of the following conditions: Perforated diverticulitis.   Critical care was time spent personally by me on the following activities:  Discussions with consultants, evaluation of patient's response to treatment, examination of patient, ordering and  performing treatments and interventions, ordering and review of laboratory studies, ordering and review of radiographic studies, pulse oximetry, re-evaluation of patient's condition, obtaining history from patient or surrogate and review of old charts   (including critical care time)  Medications Ordered  in ED Medications  sodium chloride 0.9 % bolus 1,000 mL (1,000 mLs Intravenous New Bag/Given 11/07/18 0838)  ondansetron (ZOFRAN) injection 4 mg (4 mg Intravenous Given 11/07/18 0839)  morphine 4 MG/ML injection 4 mg (4 mg Intravenous Given 11/07/18 0840)  iopamidol (ISOVUE-300) 61 % injection 100 mL (100 mLs Intravenous Contrast Given 11/07/18 0942)  morphine 4 MG/ML injection 4 mg (4 mg Intravenous Given 11/07/18 1005)  piperacillin-tazobactam (ZOSYN) IVPB 3.375 g (3.375 g Intravenous New Bag/Given 11/07/18 1016)     Initial Impression / Assessment and Plan / ED Course  I have reviewed the triage vital signs and the nursing notes.  Pertinent labs & imaging results that were available during my care of the patient were reviewed by me and considered in my medical decision making (see chart for details).  Patient presents with lower abdominal pain starting yesterday, low-grade fever at home, associated nausea.  Has also been constipated.  On arrival patient is afebrile with normal vitals.  He has focal tenderness in the left lower quadrant and suprapubic region with some guarding.  I suspect diverticulitis, will get labs and CT abdomen pelvis, pain medication, nausea medicine and fluids given.  Labs significant for leukocytosis of 17.0 with left shift, lactic acid is not elevated, no acute electrolyte derangements, normal renal and liver function.  Awaiting urinalysis.  Pain has persisted we will give additional dose of morphine.  CT scan consistent with ruptured sigmoid diverticulitis with free air tracking along the vascular pedicle of the mesentery.  Will discuss with general surgery.  Zosyn given  for antibiotic coverage.  COVID test ordered for admission.  Discussed with general surgery PA, they will see the patient and plan to admit to surgery service.  Patient discussed with Dr. Johnney Killian, who saw patient as well and agrees with plan.   Final Clinical Impressions(s) / ED Diagnoses   Final diagnoses:  Diverticulitis of large intestine with perforation without bleeding    ED Discharge Orders    None       Janet Berlin 11/07/18 1110    Charlesetta Shanks, MD 11/11/18 1122

## 2018-11-07 NOTE — ED Notes (Signed)
Patient transported to CT 

## 2018-11-07 NOTE — ED Notes (Signed)
Surgery at bedside for evaluation

## 2018-11-07 NOTE — ED Provider Notes (Signed)
Medical screening examination/treatment/procedure(s) were conducted as a shared visit with non-physician practitioner(s) and myself.  I personally evaluated the patient during the encounter.    Patient reports he pretty abruptly got short left lower quadrant pain.  He attributes it to having eaten a salad.  No chest pain no shortness of breath.  He reports he was however up all night urinating and having hot and cold episodes.  He is alert and appropriate.  No respiratory distress.  Abdomen is soft but he does have guarding in the suprapubic and left lower quadrant.  Skin is warm and dry.  I agree with plan and management.   Charlesetta Shanks, MD 11/07/18 1013

## 2018-11-07 NOTE — H&P (Addendum)
[facility] Admission Note  John Morse [birth year]  [id number].    Requesting MD: Charlesetta Shanks Chief Complaint/Reason for Consult: diverticulitis  HPI:  John Morse is a 64yo male PMH HTN, HLD, pre-DM managing with diet/exercise, and psoriatic arthritis on Cosentyx, who presented to [facility] earlier today with abdominal pain. States that it started yesterday afternoon after eating a salad. Pain is mostly suprapubic/LLQ. Constant and severe. Worse with palpation and movement. Some nausea, no emesis. He had a normal but small BM this morning. Denies fever or chills. States that he has never had pain like this before. No prior h/o diverticulitis. Reports that his last colonoscopy was 2019 by Dr. Cristina Gong, per patient is was normal.  ED workup included CT scan which showed diverticulitis in the sigmoid colon with likely diverticular rupture and air extending along the vascular pedicle of mesentery. WBC 17, no lactic acidosis, VSS.  Abdominal surgical history: none Anticoagulants: none Former smoker, quit in [place]: works for [employer], Network engineer job  ROS: Review of Systems  Constitutional: Negative.  Negative for chills and fever.  HENT: Negative.   Eyes: Negative.   Respiratory: Negative.   Cardiovascular: Negative.   Gastrointestinal: Positive for abdominal pain and nausea. Negative for constipation, diarrhea and vomiting.  Genitourinary: Negative.   Musculoskeletal: Negative.   Skin: Negative.   Neurological: Negative.    All systems reviewed and otherwise negative except for as above  No family history on file.  Past Medical History:  Diagnosis Date  . Arthritis   . Carpal tunnel syndrome    rt  . Complication of anesthesia    vomiting  . Hyperlipemia   . Hypertension   . PONV (postoperative nausea and vomiting)     Past Surgical History:  Procedure Laterality Date  . ARTHROSCOPIC REPAIR ACL     rt knee  . CARPAL TUNNEL RELEASE     lt  .  CARPAL TUNNEL RELEASE  05/25/2011   Procedure: CARPAL TUNNEL RELEASE;  Surgeon: Cammie Sickle., MD;  Location: [facility];  Service: Orthopedics;  Laterality: Right;  . CERVICAL FUSION    . SHOULDER ARTHROSCOPY W/ ROTATOR CUFF REPAIR     rightx3  . SHOULDER ARTHROSCOPY W/ ROTATOR CUFF REPAIR     left   . TONSILLECTOMY      Social History:  reports that he quit smoking about 21 years ago. He has never used smokeless tobacco. He reports current alcohol use. He reports that he does not use drugs.  Allergies:  Allergies  Allergen Reactions  . Other Anaphylaxis    Embrel knots at the injection site  . Avapro [Irbesartan] Cough  . Codeine Nausea And Vomiting  . Lisinopril Cough  . Otezla [Apremilast] Cough    (Not in a hospital admission)   Prior to Admission medications   Medication Sig Start Date End Date Taking? Authorizing Provider  Ascorbic Acid (VITAMIN C) 1000 MG tablet Take 1,000 mg by mouth daily.   Yes [provider]  aspirin 81 MG chewable tablet Chew 81 mg by mouth daily.   Yes [provider]  atenolol-chlorthalidone (TENORETIC) 50-25 MG per tablet Take 1 tablet by mouth daily.   Yes [provider]  cetirizine (ZYRTEC) 10 MG tablet Take 10 mg by mouth daily. 08/19/18  Yes [provider]  COSENTYX SENSOREADY, 300 MG, 150 MG/ML SOAJ Inject 300 mg as directed every 30 (thirty) days. (2) 150 mg injections 10/14/18  Yes [provider]  ferrous sulfate  324 MG TBEC Take 324 mg by mouth daily with breakfast.   Yes [provider]  folic acid (FOLVITE) 643 MCG tablet Take 400 mcg by mouth daily.   Yes [provider]  ipratropium (ATROVENT) 0.06 % nasal spray Place 2 sprays into both nostrils as needed for rhinitis.   Yes [provider]  Omega-3 Fatty Acids (FISH OIL PO) Take by mouth.   Yes [provider]  pravastatin (PRAVACHOL) 40 MG tablet Take 40 mg by mouth daily.   Yes  [provider]  vitamin E 400 UNIT capsule Take 400 Units by mouth daily.   Yes [provider]    Blood pressure (!) 148/90, pulse 86, temperature 98.4 F (36.9 C), temperature source Oral, resp. rate 16, height 6' (1.829 m), weight 106.6 kg, SpO2 96 %. Physical Exam: General: pleasant, WD/WN white male who is laying in bed in NAD HEENT: head is normocephalic, atraumatic.  Sclera are noninjected.  Pupils equal and round.  Ears and nose without any masses or lesions.  Mouth is pink and moist. Dentition fair Heart: regular, rate, and rhythm.  No obvious murmurs, gallops, or rubs noted.  Palpable pedal pulses bilaterally Lungs: CTAB, no wheezes, rhonchi, or rales noted.  Respiratory effort nonlabored Abd: soft, mild distension, hypoactive BS, no masses, hernias, or organomegaly. TTP LLQ and suprapubic region with voluntary guarding, no peritonitis MS: all 4 extremities are symmetrical with no cyanosis, clubbing, or edema. Skin: warm and dry with no masses, lesions, or rashes Psych: A&Ox3 with an appropriate affect. Neuro: cranial nerves grossly intact, extremity CSM intact bilaterally, normal speech  Results for orders placed or performed during the hospital encounter of 11/07/18 (from the past 48 hour(s))  Comprehensive metabolic panel     Status: Abnormal   Collection Time: 11/07/18  8:32 AM  Result Value Ref Range   Sodium 136 135 - 145 mmol/L   Potassium 3.4 (L) 3.5 - 5.1 mmol/L   Chloride 99 98 - 111 mmol/L   CO2 28 22 - 32 mmol/L   Glucose, Bld 142 (H) 70 - 99 mg/dL   BUN 12 6 - 20 mg/dL   Creatinine, Ser 1.15 0.61 - 1.24 mg/dL   Calcium 9.4 8.9 - 10.3 mg/dL   Total Protein 7.0 6.5 - 8.1 g/dL   Albumin 4.1 3.5 - 5.0 g/dL   AST 23 15 - 41 U/L   ALT 30 0 - 44 U/L   Alkaline Phosphatase 41 38 - 126 U/L   Total Bilirubin 1.7 (H) 0.3 - 1.2 mg/dL   GFR calc non Af Amer >60 >60 mL/min   GFR calc Af Amer >60 >60 mL/min   Anion gap 9 5 - 15    Comment: Performed at  [facility], [address], [place], [place] [address]  CBC with Differential     Status: Abnormal   Collection Time: 11/07/18  8:32 AM  Result Value Ref Range   WBC 17.0 (H) 4.0 - 10.5 K/uL   RBC 5.70 4.22 - 5.81 MIL/uL   Hemoglobin 17.3 (H) 13.0 - 17.0 g/dL   HCT 49.1 39.0 - 52.0 %   MCV 86.1 80.0 - 100.0 fL   MCH 30.4 26.0 - 34.0 pg   MCHC 35.2 30.0 - 36.0 g/dL   RDW 13.6 11.5 - 15.5 %   Platelets 164 150 - 400 K/uL   nRBC 0.0 0.0 - 0.2 %   Neutrophils Relative % 78 %   Neutro Abs 13.0 (  H) 1.7 - 7.7 K/uL   Lymphocytes Relative 12 %   Lymphs Abs 2.0 0.7 - 4.0 K/uL   Monocytes Relative 10 %   Monocytes Absolute 1.8 (H) 0.1 - 1.0 K/uL   Eosinophils Relative 0 %   Eosinophils Absolute 0.0 0.0 - 0.5 K/uL   Basophils Relative 0 %   Basophils Absolute 0.1 0.0 - 0.1 K/uL   Immature Granulocytes 0 %   Abs Immature Granulocytes 0.06 0.00 - 0.07 K/uL    Comment: Performed at [facility] [address] [place], [place] [address]  Lipase, blood     Status: None   Collection Time: 11/07/18  8:32 AM  Result Value Ref Range   Lipase 25 11 - 51 U/L    Comment: Performed at [facility] [address] [place], [place] [address]  Lactic acid, plasma     Status: None   Collection Time: 11/07/18  9:47 AM  Result Value Ref Range   Lactic Acid, Venous 0.9 0.5 - 1.9 mmol/L    Comment: Performed at [facility] [address] [place],  [address]   Ct Abdomen Pelvis W Contrast  Result Date: 11/07/2018 CLINICAL DATA:  Lower abdominal pain EXAM: CT ABDOMEN AND PELVIS WITH CONTRAST TECHNIQUE: Multidetector CT imaging of the abdomen and pelvis was performed using the standard protocol following bolus administration of intravenous contrast. CONTRAST:  123mL ISOVUE-300 IOPAMIDOL (ISOVUE-300) INJECTION 61% COMPARISON:  None. FINDINGS: Lower chest: No acute abnormality. Hepatobiliary: Mild fatty infiltration of the liver is noted. The gallbladder is within normal  limits. Pancreas: Unremarkable. No pancreatic ductal dilatation or surrounding inflammatory changes. Spleen: Normal in size without focal abnormality. Adrenals/Urinary Tract: Adrenal glands are within normal limits. Kidneys are well visualized bilaterally without renal calculi or obstructive changes. Right renal cyst is seen. Bladder is partially decompressed. Stomach/Bowel: There are changes consistent with diverticulitis in the sigmoid colon with a moderate amount of extraluminal air which tracks superiorly along the vascular pedicle. No definitive abscess is noted at this time. The more proximal colon is within normal limits. The appendix is unremarkable. The stomach and small bowel are within normal limits. Vascular/Lymphatic: Aortic atherosclerosis. No enlarged abdominal or pelvic lymph nodes. Reproductive: Prostate is unremarkable. Other: No abdominal wall hernia or abnormality. No abdominopelvic ascites. Musculoskeletal: No acute or significant osseous findings. IMPRESSION: Changes consistent with diverticulitis in the sigmoid colon with likely diverticular rupture and air extending along the vascular pedicle of mesentery. Electronically Signed   By: Inez Catalina M.D.   On: 11/07/2018 09:49      Assessment/Plan HTN HLD Pre-DM managing with diet/exercise Psoriatic arthritis - on Cosentyx  Sigmoid diverticulitis with perforation - 1st bout of diverticulitis - last colonoscopy 2019 by Dr. Cristina Gong, per patient it was normal - CT 7/2 shows diverticulitis in the sigmoid colon with likely diverticular rupture and air extending along the vascular pedicle of mesentery - WBC 17, no lactic acidosis, VSS - Patient is tender in the LLQ/suprapubic region but no peritonitis on exam. Discussed case with MD. Will admit for bowel rest and IV antibiotics. Hopefully this will resolve without surgery. If he does not improve will require colectomy/colostomy this admission. Covid test pending.  ID - [id number]  7/2>> VTE - SCDs, lovenox FEN - IVF, NPO except ice Foley - none Follow up - TBD  Wellington Hampshire, [facility] 11/07/2018, 11:06 AM Pager: [phone] Mon-Thurs 7:00 am-4:30 pm Fri 7:00 am -11:30 AM Sat-Sun 7:00 am-11:30  am

## 2018-11-07 NOTE — ED Notes (Signed)
ED TO INPATIENT HANDOFF REPORT  ED Nurse Name and Phone #: Caryl Pina RN [phone]  S Name/Age/Gender John Morse 63 y.o. male Room/Bed: 038C/038C  Code Status   Code Status: Full Code  Home/SNF/Other Home Patient oriented to: situation Is this baseline? Yes   Triage Complete: Triage complete  Chief Complaint abd pain  Triage Note Pt arrives to the ED with lower abd pain that started yesterday at lunch time after eating a salad- pt also reports fever and chills that began at the same time.    Allergies Allergies  Allergen Reactions  . Other Anaphylaxis    Embrel knots at the injection site  . Avapro [Irbesartan] Cough  . Codeine Nausea And Vomiting  . Lisinopril Cough  . [place] Nail [Apremilast] Cough    Level of Care/Admitting Diagnosis ED Disposition    ED Disposition Condition Comment   Admit  Hospital Area: [facility] [[id number]]  Level of Care: Med-Surg [16]  Covid Evaluation: N/A  Diagnosis: Sigmoid diverticulitis [786767]  Admitting Physician: CCS, [place]  Attending Physician: CCS, MD [[id number]]  Estimated length of stay: 3 - 4 days  Certification:: I certify this patient will need inpatient services for at least 2 midnights  Bed request comments: 6N  PT Class (Do Not Modify): Inpatient [101]  PT Acc Code (Do Not Modify): Private [1]       B Medical/Surgery History Past Medical History:  Diagnosis Date  . Arthritis   . Carpal tunnel syndrome    rt  . Complication of anesthesia    vomiting  . Hyperlipemia   . Hypertension   . PONV (postoperative nausea and vomiting)    Past Surgical History:  Procedure Laterality Date  . ARTHROSCOPIC REPAIR ACL     rt knee  . CARPAL TUNNEL RELEASE     lt  . CARPAL TUNNEL RELEASE  05/25/2011   Procedure: CARPAL TUNNEL RELEASE;  Surgeon: Cammie Sickle., MD;  Location: [facility];  Service: Orthopedics;  Laterality: Right;  . CERVICAL FUSION    . SHOULDER ARTHROSCOPY W/ ROTATOR  CUFF REPAIR     rightx3  . SHOULDER ARTHROSCOPY W/ ROTATOR CUFF REPAIR     left   . TONSILLECTOMY       A IV Location/Drains/Wounds Patient Lines/Drains/Airways Status   Active Line/Drains/Airways    Name:   Placement date:   Placement time:   Site:   Days:   Peripheral IV 11/07/18 Right Antecubital   11/07/18    0838    Antecubital   less than 1   Incision 05/25/11 Hand Right   05/25/11    1101     2723          Intake/Output Last 24 hours  Intake/Output Summary (Last 24 hours) at 11/07/2018 1305 Last data filed at 11/07/2018 1046 Gross per 24 hour  Intake 1100 ml  Output -  Net 1100 ml    Labs/Imaging Results for orders placed or performed during the hospital encounter of 11/07/18 (from the past 48 hour(s))  Urinalysis, Routine w reflex microscopic     Status: Abnormal   Collection Time: 11/07/18  8:29 AM  Result Value Ref Range   Color, Urine STRAW (A) YELLOW   APPearance CLEAR CLEAR   Specific Gravity, Urine 1.032 (H) 1.005 - 1.030   pH 7.0 5.0 - 8.0   Glucose, UA NEGATIVE NEGATIVE mg/dL   Hgb urine dipstick NEGATIVE NEGATIVE   Bilirubin Urine NEGATIVE NEGATIVE   Ketones, ur  NEGATIVE NEGATIVE mg/dL   Protein, ur NEGATIVE NEGATIVE mg/dL   Nitrite NEGATIVE NEGATIVE   Leukocytes,Ua NEGATIVE NEGATIVE    Comment: Performed at [facility] [address] [place], [place] [address]  Comprehensive metabolic panel     Status: Abnormal   Collection Time: 11/07/18  8:32 AM  Result Value Ref Range   Sodium 136 135 - 145 mmol/L   Potassium 3.4 (L) 3.5 - 5.1 mmol/L   Chloride 99 98 - 111 mmol/L   CO2 28 22 - 32 mmol/L   Glucose, Bld 142 (H) 70 - 99 mg/dL   BUN 12 6 - 20 mg/dL   Creatinine, Ser 1.15 0.61 - 1.24 mg/dL   Calcium 9.4 8.9 - 10.3 mg/dL   Total Protein 7.0 6.5 - 8.1 g/dL   Albumin 4.1 3.5 - 5.0 g/dL   AST 23 15 - 41 U/L   ALT 30 0 - 44 U/L   Alkaline Phosphatase 41 38 - 126 U/L   Total Bilirubin 1.7 (H) 0.3 - 1.2 mg/dL   GFR calc non Af Amer >60 >60  mL/min   GFR calc Af Amer >60 >60 mL/min   Anion gap 9 5 - 15    Comment: Performed at [facility] [address] [place], [place] [address]  CBC with Differential     Status: Abnormal   Collection Time: 11/07/18  8:32 AM  Result Value Ref Range   WBC 17.0 (H) 4.0 - 10.5 K/uL   RBC 5.70 4.22 - 5.81 MIL/uL   Hemoglobin 17.3 (H) 13.0 - 17.0 g/dL   HCT 49.1 39.0 - 52.0 %   MCV 86.1 80.0 - 100.0 fL   MCH 30.4 26.0 - 34.0 pg   MCHC 35.2 30.0 - 36.0 g/dL   RDW 13.6 11.5 - 15.5 %   Platelets 164 150 - 400 K/uL   nRBC 0.0 0.0 - 0.2 %   Neutrophils Relative % 78 %   Neutro Abs 13.0 (H) 1.7 - 7.7 K/uL   Lymphocytes Relative 12 %   Lymphs Abs 2.0 0.7 - 4.0 K/uL   Monocytes Relative 10 %   Monocytes Absolute 1.8 (H) 0.1 - 1.0 K/uL   Eosinophils Relative 0 %   Eosinophils Absolute 0.0 0.0 - 0.5 K/uL   Basophils Relative 0 %   Basophils Absolute 0.1 0.0 - 0.1 K/uL   Immature Granulocytes 0 %   Abs Immature Granulocytes 0.06 0.00 - 0.07 K/uL    Comment: Performed at [facility], [place] [address], [place], [place] [address]  Lipase, blood     Status: None   Collection Time: 11/07/18  8:32 AM  Result Value Ref Range   Lipase 25 11 - 51 U/L    Comment: Performed at [facility] [address], [place], [place] [address]  Lactic acid, plasma     Status: None   Collection Time: 11/07/18  9:47 AM  Result Value Ref Range   Lactic Acid, Venous 0.9 0.5 - 1.9 mmol/L    Comment: Performed at [facility] [address], [place], [place] [address]  Creatinine, serum     Status: None   Collection Time: 11/07/18 11:20 AM  Result Value Ref Range   Creatinine, Ser 1.19 0.61 - 1.24 mg/dL   GFR calc non Af Amer >60 >60 mL/min   GFR calc Af Amer >60 >60 mL/min    Comment: Performed at [facility]  [address], [place], [place] [address]   Ct Abdomen Pelvis W Contrast  Result Date: 11/07/2018 CLINICAL DATA:  Lower abdominal pain EXAM: CT ABDOMEN AND PELVIS WITH  CONTRAST TECHNIQUE: Multidetector CT imaging of the abdomen and pelvis was performed using the standard protocol following bolus administration of intravenous contrast. CONTRAST:  173mL ISOVUE-300 IOPAMIDOL (ISOVUE-300) INJECTION 61% COMPARISON:  None. FINDINGS: Lower chest: No acute abnormality. Hepatobiliary: Mild fatty infiltration of the liver is noted. The gallbladder is within normal limits. Pancreas: Unremarkable. No pancreatic ductal dilatation or surrounding inflammatory changes. Spleen: Normal in size without focal abnormality. Adrenals/Urinary Tract: Adrenal glands are within normal limits. Kidneys are well visualized bilaterally without renal calculi or obstructive changes. Right renal cyst is seen. Bladder is partially decompressed. Stomach/Bowel: There are changes consistent with diverticulitis in the sigmoid colon with a moderate amount of extraluminal air which tracks superiorly along the vascular pedicle. No definitive abscess is noted at this time. The more proximal colon is within normal limits. The appendix is unremarkable. The stomach and small bowel are within normal limits. Vascular/Lymphatic: Aortic atherosclerosis. No enlarged abdominal or pelvic lymph nodes. Reproductive: Prostate is unremarkable. Other: No abdominal wall hernia or abnormality. No abdominopelvic ascites. Musculoskeletal: No acute or significant osseous findings. IMPRESSION: Changes consistent with diverticulitis in the sigmoid colon with likely diverticular rupture and air extending along the vascular pedicle of mesentery. Electronically Signed   By: Inez Catalina M.D.   On: 11/07/2018 09:49    Pending Labs Unresulted Labs (From admission, onward)    Start     Ordered   11/14/18 0500  Creatinine, serum  (enoxaparin (LOVENOX)    CrCl >/= 30 ml/min)  Weekly,   R    Comments: while on enoxaparin therapy    11/07/18 1105   11/08/18 9563  Basic metabolic panel  Tomorrow morning,   R     11/07/18 1105   11/08/18 0500   CBC  Tomorrow morning,   R     11/07/18 1105   11/08/18 0500  Hemoglobin A1c  Tomorrow morning,   R     11/07/18 1106   11/07/18 1056  HIV antibody (Routine Testing)  Once,   STAT     11/07/18 1105   11/07/18 1043  SARS Coronavirus 2 (CEPHEID - Performed in [facility] hospital lab), Hosp Order  (Asymptomatic Patients Labs)  Once,   STAT    Question:  Rule Out  Answer:  Yes   11/07/18 1042          Vitals/Pain Today's Vitals   11/07/18 1200 11/07/18 1215 11/07/18 1245 11/07/18 1300  BP: 138/67 105/65 125/70 126/66  Pulse: 84 77 81 79  Resp: 16   16  Temp:      TempSrc:      SpO2: 98% 94% 97% 97%  Weight:      Height:      PainSc: 5    5     Isolation Precautions No active isolations  Medications Medications  enoxaparin (LOVENOX) injection 40 mg (has no administration in time range)  0.45 % NaCl with KCl 20 mEq / L infusion (has no administration in time range)  piperacillin-tazobactam (ZOSYN) IVPB 3.375 g (has no administration in time range)  metoprolol tartrate (LOPRESSOR) injection 5 mg (has no administration in time range)  pantoprazole (PROTONIX) injection 40 mg (has no administration in time range)  simethicone (MYLICON) chewable tablet 40 mg (has no administration in time range)  ondansetron (ZOFRAN-ODT) disintegrating tablet 4 mg (has no administration  in time range)    Or  ondansetron (ZOFRAN) injection 4 mg (has no administration in time range)  polyethylene glycol (MIRALAX / GLYCOLAX) packet 17 g (has no administration in time range)  diphenhydrAMINE (BENADRYL) 12.5 MG/5ML elixir 12.5 mg (has no administration in time range)    Or  diphenhydrAMINE (BENADRYL) injection 12.5 mg (has no administration in time range)  methocarbamol (ROBAXIN) tablet 500 mg (has no administration in time range)  acetaminophen (TYLENOL) tablet 650 mg (has no administration in time range)    Or  acetaminophen (TYLENOL) suppository 650 mg (has no administration in time range)   oxyCODONE (Oxy IR/ROXICODONE) immediate release tablet 5-10 mg (has no administration in time range)  morphine 2 MG/ML injection 2-4 mg (has no administration in time range)  sodium chloride 0.9 % bolus 1,000 mL (0 mLs Intravenous Stopped 11/07/18 1030)  ondansetron (ZOFRAN) injection 4 mg (4 mg Intravenous Given 11/07/18 0839)  morphine 4 MG/ML injection 4 mg (4 mg Intravenous Given 11/07/18 0840)  iopamidol (ISOVUE-300) 61 % injection 100 mL (100 mLs Intravenous Contrast Given 11/07/18 0942)  morphine 4 MG/ML injection 4 mg (4 mg Intravenous Given 11/07/18 1005)  piperacillin-tazobactam (ZOSYN) IVPB 3.375 g (0 g Intravenous Stopped 11/07/18 1046)    Mobility walks Low fall risk   Focused Assessments GI assessment   R Recommendations: See Admitting Provider Note  Report given to:   Additional Notes:  Possible surgery

## 2018-11-07 NOTE — ED Triage Notes (Signed)
Pt arrives to the ED with lower abd pain that started yesterday at lunch time after eating a salad- pt also reports fever and chills that began at the same time.

## 2018-11-08 ENCOUNTER — Encounter (HOSPITAL_COMMUNITY): Payer: Self-pay | Admitting: General Practice

## 2018-11-08 LAB — BASIC METABOLIC PANEL
Anion gap: 12 (ref 5–15)
BUN: 12 mg/dL (ref 6–20)
CO2: 27 mmol/L (ref 22–32)
Calcium: 8.5 mg/dL — ABNORMAL LOW (ref 8.9–10.3)
Chloride: 97 mmol/L — ABNORMAL LOW (ref 98–111)
Creatinine, Ser: 1.38 mg/dL — ABNORMAL HIGH (ref 0.61–1.24)
GFR calc Af Amer: >60 mL/min (ref >60)
GFR calc non Af Amer: 55 mL/min — ABNORMAL LOW (ref >60)
Glucose, Bld: 87 mg/dL (ref 70–99)
Potassium: 2.8 mmol/L — ABNORMAL LOW (ref 3.5–5.1)
Sodium: 136 mmol/L (ref 135–145)

## 2018-11-08 LAB — CBC
HCT: 43.8 % (ref 39.0–52.0)
Hemoglobin: 15.7 g/dL (ref 13.0–17.0)
MCH: 30.8 pg (ref 26.0–34.0)
MCHC: 35.8 g/dL (ref 30.0–36.0)
MCV: 85.9 fL (ref 80.0–100.0)
Platelets: 143 10*3/uL — ABNORMAL LOW (ref 150–400)
RBC: 5.1 MIL/uL (ref 4.22–5.81)
RDW: 13.6 % (ref 11.5–15.5)
WBC: 13.4 10*3/uL — ABNORMAL HIGH (ref 4.0–10.5)
nRBC: 0 % (ref 0.0–0.2)

## 2018-11-08 LAB — HEMOGLOBIN A1C
Hgb A1c MFr Bld: 5.8 % — ABNORMAL HIGH (ref 4.8–5.6)
Mean Plasma Glucose: 119.76 mg/dL

## 2018-11-08 NOTE — Plan of Care (Signed)
  Problem: Pain Managment: Goal: General experience of comfort will improve Outcome: Progressing   Problem: Safety: Goal: Ability to remain free from injury will improve 11/08/2018 2217 by Roselind Rily, RN Outcome: Progressing 11/08/2018 2217 by Roselind Rily, RN Outcome: Progressing   Problem: Skin Integrity: Goal: Risk for impaired skin integrity will decrease 11/08/2018 2217 by Roselind Rily, RN Outcome: Progressing 11/08/2018 2217 by Roselind Rily, RN Outcome: Progressing

## 2018-11-08 NOTE — Progress Notes (Signed)
Paged Dr. Dema Severin regarding patient's K+ of 2.8 was told it would be addressed during rounds in later.

## 2018-11-08 NOTE — Progress Notes (Signed)
Patient ID: John Morse, male   DOB: Oct 04, 1955, 63 y.o.   MRN: [id number] [facility] Surgery Progress Note:   * No surgery found *  Subjective: Mental status is clear Objective: Vital signs in last 24 hours: Temp:  [98.4 F (36.9 C)-101.6 F (38.7 C)] 98.4 F (36.9 C) (07/03 0358) Pulse Rate:  [75-88] 79 (07/03 0358) Resp:  [16-20] 16 (07/03 0358) BP: (105-148)/(48-96) 132/63 (07/03 0358) SpO2:  [93 %-99 %] 93 % (07/03 0358)  Intake/Output from previous day: 07/02 0701 - 07/03 0700 In: 2569.9 [I.V.:1388.6; IV Piggyback:1181.2] Out: -  Intake/Output this shift: No intake/output data recorded.  Physical Exam: Work of breathing is normal.  Pain is maybe a little less  Lab Results:  Results for orders placed or performed during the hospital encounter of 11/07/18 (from the past 48 hour(s))  Urinalysis, Routine w reflex microscopic     Status: Abnormal   Collection Time: 11/07/18  8:29 AM  Result Value Ref Range   Color, Urine STRAW (A) YELLOW   APPearance CLEAR CLEAR   Specific Gravity, Urine 1.032 (H) 1.005 - 1.030   pH 7.0 5.0 - 8.0   Glucose, UA NEGATIVE NEGATIVE mg/dL   Hgb urine dipstick NEGATIVE NEGATIVE   Bilirubin Urine NEGATIVE NEGATIVE   Ketones, ur NEGATIVE NEGATIVE mg/dL   Protein, ur NEGATIVE NEGATIVE mg/dL   Nitrite NEGATIVE NEGATIVE   Leukocytes,Ua NEGATIVE NEGATIVE    Comment: Performed at [facility] [address] [place], [place] [address]  Comprehensive metabolic panel     Status: Abnormal   Collection Time: 11/07/18  8:32 AM  Result Value Ref Range   Sodium 136 135 - 145 mmol/L   Potassium 3.4 (L) 3.5 - 5.1 mmol/L   Chloride 99 98 - 111 mmol/L   CO2 28 22 - 32 mmol/L   Glucose, Bld 142 (H) 70 - 99 mg/dL   BUN 12 6 - 20 mg/dL   Creatinine, Ser 1.15 0.61 - 1.24 mg/dL   Calcium 9.4 8.9 - 10.3 mg/dL   Total Protein 7.0 6.5 - 8.1 g/dL   Albumin 4.1 3.5 - 5.0 g/dL   AST 23 15 - 41 U/L   ALT 30 0 - 44 U/L   Alkaline Phosphatase 41 38  - 126 U/L   Total Bilirubin 1.7 (H) 0.3 - 1.2 mg/dL   GFR calc non Af Amer >60 >60 mL/min   GFR calc Af Amer >60 >60 mL/min   Anion gap 9 5 - 15    Comment: Performed at [facility] [address], [place], [place] [address]  CBC with Differential     Status: Abnormal   Collection Time: 11/07/18  8:32 AM  Result Value Ref Range   WBC 17.0 (H) 4.0 - 10.5 K/uL   RBC 5.70 4.22 - 5.81 MIL/uL   Hemoglobin 17.3 (H) 13.0 - 17.0 g/dL   HCT 49.1 39.0 - 52.0 %   MCV 86.1 80.0 - 100.0 fL   MCH 30.4 26.0 - 34.0 pg   MCHC 35.2 30.0 - 36.0 g/dL   RDW 13.6 11.5 - 15.5 %   Platelets 164 150 - 400 K/uL   nRBC 0.0 0.0 - 0.2 %   Neutrophils Relative % 78 %   Neutro Abs 13.0 (H) 1.7 - 7.7 K/uL   Lymphocytes Relative 12 %   Lymphs Abs 2.0 0.7 - 4.0 K/uL   Monocytes Relative 10 %   Monocytes Absolute 1.8 (H) 0.1 - 1.0 K/uL  Eosinophils Relative 0 %   Eosinophils Absolute 0.0 0.0 - 0.5 K/uL   Basophils Relative 0 %   Basophils Absolute 0.1 0.0 - 0.1 K/uL   Immature Granulocytes 0 %   Abs Immature Granulocytes 0.06 0.00 - 0.07 K/uL    Comment: Performed at [facility] [address], [place], [place] [address]  Lipase, blood     Status: None   Collection Time: 11/07/18  8:32 AM  Result Value Ref Range   Lipase 25 11 - 51 U/L    Comment: Performed at [facility] [address] [place], [place] [address]  Lactic acid, plasma     Status: None   Collection Time: 11/07/18  9:47 AM  Result Value Ref Range   Lactic Acid, Venous 0.9 0.5 - 1.9 mmol/L    Comment: Performed at [facility] [address], [place], [place] [address]  SARS Coronavirus 2 (CEPHEID - Performed in [facility] hospital lab), Hosp Order     Status: None   Collection Time: 11/07/18 10:43 AM   Specimen: Nasopharyngeal Swab  Result Value Ref Range   SARS Coronavirus 2 NEGATIVE NEGATIVE    Comment: (NOTE) If result is NEGATIVE SARS-CoV-2 target nucleic acids are NOT DETECTED. The SARS-CoV-2 RNA is  generally detectable in upper and lower  respiratory specimens during the acute phase of infection. The lowest  concentration of SARS-CoV-2 viral copies this assay can detect is 250  copies / mL. A negative result does not preclude SARS-CoV-2 infection  and should not be used as the sole basis for treatment or other  patient management decisions.  A negative result may occur with  improper specimen collection / handling, submission of specimen other  than nasopharyngeal swab, presence of viral mutation(s) within the  areas targeted by this assay, and inadequate number of viral copies  (<250 copies / mL). A negative result must be combined with clinical  observations, patient history, and epidemiological information. If result is POSITIVE SARS-CoV-2 target nucleic acids are DETECTED. The SARS-CoV-2 RNA is generally detectable in upper and lower  respiratory specimens dur ing the acute phase of infection.  Positive  results are indicative of active infection with SARS-CoV-2.  Clinical  correlation with patient history and other diagnostic information is  necessary to determine patient infection status.  Positive results do  not rule out bacterial infection or co-infection with other viruses. If result is PRESUMPTIVE POSTIVE SARS-CoV-2 nucleic acids MAY BE PRESENT.   A presumptive positive result was obtained on the submitted specimen  and confirmed on repeat testing.  While 2019 novel coronavirus  (SARS-CoV-2) nucleic acids may be present in the submitted sample  additional confirmatory testing may be necessary for epidemiological  and / or clinical management purposes  to differentiate between  SARS-CoV-2 and other Sarbecovirus currently known to infect humans.  If clinically indicated additional testing with an alternate test  methodology [phone]) is advised. The SARS-CoV-2 RNA is generally  detectable in upper and lower respiratory sp ecimens during the acute  phase of  infection. The expected result is Negative. Fact Sheet for Patients:  StrictlyIdeas.no Fact Sheet for Healthcare Providers: BankingDealers.co.za This test is not yet approved or cleared by the [place] FDA and has been authorized for detection and/or diagnosis of SARS-CoV-2 by FDA under an Emergency Use Authorization (EUA).  This EUA will remain in effect (meaning this test can be used) for the duration of the COVID-19 declaration under Section 564(b)(1) of  the Act, 21 U.S.C. section 360bbb-3(b)(1), unless the authorization is terminated or revoked sooner. Performed at [facility], [place] [address] [place], [place] [address]   Creatinine, serum     Status: None   Collection Time: 11/07/18 11:20 AM  Result Value Ref Range   Creatinine, Ser 1.19 0.61 - 1.24 mg/dL   GFR calc non Af Amer >60 >60 mL/min   GFR calc Af Amer >60 >60 mL/min    Comment: Performed at [facility] [address] [place], [place] [address]  Basic metabolic panel     Status: Abnormal   Collection Time: 11/08/18  3:30 AM  Result Value Ref Range   Sodium 136 135 - 145 mmol/L   Potassium 2.8 (L) 3.5 - 5.1 mmol/L   Chloride 97 (L) 98 - 111 mmol/L   CO2 27 22 - 32 mmol/L   Glucose, Bld 87 70 - 99 mg/dL   BUN 12 6 - 20 mg/dL   Creatinine, Ser 1.38 (H) 0.61 - 1.24 mg/dL   Calcium 8.5 (L) 8.9 - 10.3 mg/dL   GFR calc non Af Amer 55 (L) >60 mL/min   GFR calc Af Amer >60 >60 mL/min   Anion gap 12 5 - 15    Comment: Performed at [facility] [address], [place], [place] [address]  CBC     Status: Abnormal   Collection Time: 11/08/18  3:30 AM  Result Value Ref Range   WBC 13.4 (H) 4.0 - 10.5 K/uL   RBC 5.10 4.22 - 5.81 MIL/uL   Hemoglobin 15.7 13.0 - 17.0 g/dL   HCT 43.8 39.0 - 52.0 %   MCV 85.9 80.0 - 100.0 fL   MCH 30.8 26.0 - 34.0 pg   MCHC 35.8 30.0 - 36.0 g/dL   RDW 13.6 11.5 - 15.5 %   Platelets 143 (L) 150 - 400 K/uL   nRBC 0.0  0.0 - 0.2 %    Comment: Performed at [facility], [place] [address] [place], [place] [address]  Hemoglobin A1c     Status: Abnormal   Collection Time: 11/08/18  3:30 AM  Result Value Ref Range   Hgb A1c MFr Bld 5.8 (H) 4.8 - 5.6 %    Comment: (NOTE) Pre diabetes:          5.7%-6.4% Diabetes:              >6.4% Glycemic control for   <7.0% adults with diabetes    Mean Plasma Glucose 119.76 mg/dL    Comment: Performed at [facility] [address], [place], [place] [address]    Radiology/Results: Ct Abdomen Pelvis W Contrast  Result Date: 11/07/2018 CLINICAL DATA:  Lower abdominal pain EXAM: CT ABDOMEN AND PELVIS WITH CONTRAST TECHNIQUE: Multidetector CT imaging of the abdomen and pelvis was performed using the standard protocol following bolus administration of intravenous contrast. CONTRAST:  114mL ISOVUE-300 IOPAMIDOL (ISOVUE-300) INJECTION 61% COMPARISON:  None. FINDINGS: Lower chest: No acute abnormality. Hepatobiliary: Mild fatty infiltration of the liver is noted. The gallbladder is within normal limits. Pancreas: Unremarkable. No pancreatic ductal dilatation or surrounding inflammatory changes. Spleen: Normal in size without focal abnormality. Adrenals/Urinary Tract: Adrenal glands are within normal limits. Kidneys are well visualized bilaterally without renal calculi or obstructive changes. Right renal cyst is seen. Bladder is partially decompressed. Stomach/Bowel: There are changes consistent with diverticulitis in the sigmoid colon with a moderate amount of extraluminal air which tracks superiorly along the  vascular pedicle. No definitive abscess is noted at this time. The more proximal colon is within normal limits. The appendix is unremarkable. The stomach and small bowel are within normal limits. Vascular/Lymphatic: Aortic atherosclerosis. No enlarged abdominal or pelvic lymph nodes. Reproductive: Prostate is unremarkable. Other: No abdominal wall hernia or abnormality. No  abdominopelvic ascites. Musculoskeletal: No acute or significant osseous findings. IMPRESSION: Changes consistent with diverticulitis in the sigmoid colon with likely diverticular rupture and air extending along the vascular pedicle of mesentery. Electronically Signed   By: Inez Catalina M.D.   On: 11/07/2018 09:49    Anti-infectives: Anti-infectives (From admission, onward)   Start     Dose/Rate Route Frequency Ordered Stop   11/07/18 1430  piperacillin-tazobactam (ZOSYN) IVPB 3.375 g     3.375 g 12.5 mL/hr over 240 Minutes Intravenous Every 8 hours 11/07/18 1105     11/07/18 1015  piperacillin-tazobactam (ZOSYN) IVPB 3.375 g     3.375 g 100 mL/hr over 30 Minutes Intravenous  Once 11/07/18 1003 11/07/18 1046      Assessment/Plan: Problem List: Patient Active Problem List   Diagnosis Date Noted  . Sigmoid diverticulitis 11/07/2018    Improved.  Passed flatus and BM.  Start clears * No surgery found *    LOS: 1 day   Matt B. Hassell Done, MD, [facility] [phone] beeper [phone]  11/08/2018 9:56 AM

## 2018-11-08 NOTE — Plan of Care (Signed)
  Problem: Safety: Goal: Ability to remain free from injury will improve Outcome: Progressing   Problem: Skin Integrity: Goal: Risk for impaired skin integrity will decrease Outcome: Progressing   

## 2018-11-08 NOTE — Plan of Care (Signed)

## 2018-11-09 LAB — HIV ANTIBODY (ROUTINE TESTING W REFLEX): HIV Screen 4th Generation wRfx: NONREACTIVE

## 2018-11-09 NOTE — Progress Notes (Signed)
Patient ID: John Morse, male   DOB: [birth year], 63 y.o.   MRN: [id number] [facility] Surgery Progress Note:   * No surgery found *  Subjective: Mental status is clear and appropriate Objective: Vital signs in last 24 hours: Temp:  [98.2 F (36.8 C)-99.2 F (37.3 C)] 99.2 F (37.3 C) (07/04 0501) Pulse Rate:  [76-81] 81 (07/04 0501) Resp:  [18-20] 20 (07/04 0501) BP: (127-154)/(71-77) 138/74 (07/04 0501) SpO2:  [94 %-100 %] 95 % (07/04 0501)  Intake/Output from previous day: 07/03 0701 - 07/04 0700 In: 300 [P.O.:300] Out: -  Intake/Output this shift: No intake/output data recorded.  Physical Exam: Work of breathing is normal.  Pain is better;  Passing flatus  Lab Results:  Results for orders placed or performed during the hospital encounter of 11/07/18 (from the past 48 hour(s))  Urinalysis, Routine w reflex microscopic     Status: Abnormal   Collection Time: 11/07/18  8:29 AM  Result Value Ref Range   Color, Urine STRAW (A) YELLOW   APPearance CLEAR CLEAR   Specific Gravity, Urine 1.032 (H) 1.005 - 1.030   pH 7.0 5.0 - 8.0   Glucose, UA NEGATIVE NEGATIVE mg/dL   Hgb urine dipstick NEGATIVE NEGATIVE   Bilirubin Urine NEGATIVE NEGATIVE   Ketones, ur NEGATIVE NEGATIVE mg/dL   Protein, ur NEGATIVE NEGATIVE mg/dL   Nitrite NEGATIVE NEGATIVE   Leukocytes,Ua NEGATIVE NEGATIVE    Comment: Performed at [facility] [address], [place], [place] [address]  Comprehensive metabolic panel     Status: Abnormal   Collection Time: 11/07/18  8:32 AM  Result Value Ref Range   Sodium 136 135 - 145 mmol/L   Potassium 3.4 (L) 3.5 - 5.1 mmol/L   Chloride 99 98 - 111 mmol/L   CO2 28 22 - 32 mmol/L   Glucose, Bld 142 (H) 70 - 99 mg/dL   BUN 12 6 - 20 mg/dL   Creatinine, Ser 1.15 0.61 - 1.24 mg/dL   Calcium 9.4 8.9 - 10.3 mg/dL   Total Protein 7.0 6.5 - 8.1 g/dL   Albumin 4.1 3.5 - 5.0 g/dL   AST 23 15 - 41 U/L   ALT 30 0 - 44 U/L   Alkaline Phosphatase 41 38 - 126  U/L   Total Bilirubin 1.7 (H) 0.3 - 1.2 mg/dL   GFR calc non Af Amer >60 >60 mL/min   GFR calc Af Amer >60 >60 mL/min   Anion gap 9 5 - 15    Comment: Performed at [facility] [address], [place], [place] [address]  CBC with Differential     Status: Abnormal   Collection Time: 11/07/18  8:32 AM  Result Value Ref Range   WBC 17.0 (H) 4.0 - 10.5 K/uL   RBC 5.70 4.22 - 5.81 MIL/uL   Hemoglobin 17.3 (H) 13.0 - 17.0 g/dL   HCT 49.1 39.0 - 52.0 %   MCV 86.1 80.0 - 100.0 fL   MCH 30.4 26.0 - 34.0 pg   MCHC 35.2 30.0 - 36.0 g/dL   RDW 13.6 11.5 - 15.5 %   Platelets 164 150 - 400 K/uL   nRBC 0.0 0.0 - 0.2 %   Neutrophils Relative % 78 %   Neutro Abs 13.0 (H) 1.7 - 7.7 K/uL   Lymphocytes Relative 12 %   Lymphs Abs 2.0 0.7 - 4.0 K/uL   Monocytes Relative 10 %   Monocytes Absolute 1.8 (H) 0.1 - 1.0 K/uL  Eosinophils Relative 0 %   Eosinophils Absolute 0.0 0.0 - 0.5 K/uL   Basophils Relative 0 %   Basophils Absolute 0.1 0.0 - 0.1 K/uL   Immature Granulocytes 0 %   Abs Immature Granulocytes 0.06 0.00 - 0.07 K/uL    Comment: Performed at [facility] [address], [place], [place] [address]  Lipase, blood     Status: None   Collection Time: 11/07/18  8:32 AM  Result Value Ref Range   Lipase 25 11 - 51 U/L    Comment: Performed at [facility] [address], [place], [place] [address]  Lactic acid, plasma     Status: None   Collection Time: 11/07/18  9:47 AM  Result Value Ref Range   Lactic Acid, Venous 0.9 0.5 - 1.9 mmol/L    Comment: Performed at [facility] [address] [place], [place] [address]  SARS Coronavirus 2 (CEPHEID - Performed in [place] hospital lab), Hosp Order     Status: None   Collection Time: 11/07/18 10:43 AM   Specimen: Nasopharyngeal Swab  Result Value Ref Range   SARS Coronavirus 2 NEGATIVE NEGATIVE    Comment: (NOTE) If result is NEGATIVE SARS-CoV-2 target nucleic acids are NOT DETECTED. The SARS-CoV-2 RNA is  generally detectable in upper and lower  respiratory specimens during the acute phase of infection. The lowest  concentration of SARS-CoV-2 viral copies this assay can detect is 250  copies / mL. A negative result does not preclude SARS-CoV-2 infection  and should not be used as the sole basis for treatment or other  patient management decisions.  A negative result may occur with  improper specimen collection / handling, submission of specimen other  than nasopharyngeal swab, presence of viral mutation(s) within the  areas targeted by this assay, and inadequate number of viral copies  (<250 copies / mL). A negative result must be combined with clinical  observations, patient history, and epidemiological information. If result is POSITIVE SARS-CoV-2 target nucleic acids are DETECTED. The SARS-CoV-2 RNA is generally detectable in upper and lower  respiratory specimens dur ing the acute phase of infection.  Positive  results are indicative of active infection with SARS-CoV-2.  Clinical  correlation with patient history and other diagnostic information is  necessary to determine patient infection status.  Positive results do  not rule out bacterial infection or co-infection with other viruses. If result is PRESUMPTIVE POSTIVE SARS-CoV-2 nucleic acids MAY BE PRESENT.   A presumptive positive result was obtained on the submitted specimen  and confirmed on repeat testing.  While 2019 novel coronavirus  (SARS-CoV-2) nucleic acids may be present in the submitted sample  additional confirmatory testing may be necessary for epidemiological  and / or clinical management purposes  to differentiate between  SARS-CoV-2 and other Sarbecovirus currently known to infect humans.  If clinically indicated additional testing with an alternate test  methodology [phone]) is advised. The SARS-CoV-2 RNA is generally  detectable in upper and lower respiratory sp ecimens during the acute  phase of  infection. The expected result is Negative. Fact Sheet for Patients:  StrictlyIdeas.no Fact Sheet for Healthcare Providers: BankingDealers.co.za This test is not yet approved or cleared by the [place] FDA and has been authorized for detection and/or diagnosis of SARS-CoV-2 by FDA under an Emergency Use Authorization (EUA).  This EUA will remain in effect (meaning this test can be used) for the duration of the COVID-19 declaration under Section 564(b)(1) of  the Act, 21 U.S.C. section 360bbb-3(b)(1), unless the authorization is terminated or revoked sooner. Performed at [facility], [place] [address], [place], [place] [address]   Creatinine, serum     Status: None   Collection Time: 11/07/18 11:20 AM  Result Value Ref Range   Creatinine, Ser 1.19 0.61 - 1.24 mg/dL   GFR calc non Af Amer >60 >60 mL/min   GFR calc Af Amer >60 >60 mL/min    Comment: Performed at [facility] [address], [place], [place] [address]  Basic metabolic panel     Status: Abnormal   Collection Time: 11/08/18  3:30 AM  Result Value Ref Range   Sodium 136 135 - 145 mmol/L   Potassium 2.8 (L) 3.5 - 5.1 mmol/L   Chloride 97 (L) 98 - 111 mmol/L   CO2 27 22 - 32 mmol/L   Glucose, Bld 87 70 - 99 mg/dL   BUN 12 6 - 20 mg/dL   Creatinine, Ser 1.38 (H) 0.61 - 1.24 mg/dL   Calcium 8.5 (L) 8.9 - 10.3 mg/dL   GFR calc non Af Amer 55 (L) >60 mL/min   GFR calc Af Amer >60 >60 mL/min   Anion gap 12 5 - 15    Comment: Performed at [facility] [address], [place], [place] [address]  CBC     Status: Abnormal   Collection Time: 11/08/18  3:30 AM  Result Value Ref Range   WBC 13.4 (H) 4.0 - 10.5 K/uL   RBC 5.10 4.22 - 5.81 MIL/uL   Hemoglobin 15.7 13.0 - 17.0 g/dL   HCT 43.8 39.0 - 52.0 %   MCV 85.9 80.0 - 100.0 fL   MCH 30.8 26.0 - 34.0 pg   MCHC 35.8 30.0 - 36.0 g/dL   RDW 13.6 11.5 - 15.5 %   Platelets 143 (L) 150 - 400 K/uL   nRBC 0.0  0.0 - 0.2 %    Comment: Performed at [facility], [place] [address], [place], [place] [address]  Hemoglobin A1c     Status: Abnormal   Collection Time: 11/08/18  3:30 AM  Result Value Ref Range   Hgb A1c MFr Bld 5.8 (H) 4.8 - 5.6 %    Comment: (NOTE) Pre diabetes:          5.7%-6.4% Diabetes:              >6.4% Glycemic control for   <7.0% adults with diabetes    Mean Plasma Glucose 119.76 mg/dL    Comment: Performed at [facility] [address], [place], [place] [address]    Radiology/Results: Ct Abdomen Pelvis W Contrast  Result Date: 11/07/2018 CLINICAL DATA:  Lower abdominal pain EXAM: CT ABDOMEN AND PELVIS WITH CONTRAST TECHNIQUE: Multidetector CT imaging of the abdomen and pelvis was performed using the standard protocol following bolus administration of intravenous contrast. CONTRAST:  135mL ISOVUE-300 IOPAMIDOL (ISOVUE-300) INJECTION 61% COMPARISON:  None. FINDINGS: Lower chest: No acute abnormality. Hepatobiliary: Mild fatty infiltration of the liver is noted. The gallbladder is within normal limits. Pancreas: Unremarkable. No pancreatic ductal dilatation or surrounding inflammatory changes. Spleen: Normal in size without focal abnormality. Adrenals/Urinary Tract: Adrenal glands are within normal limits. Kidneys are well visualized bilaterally without renal calculi or obstructive changes. Right renal cyst is seen. Bladder is partially decompressed. Stomach/Bowel: There are changes consistent with diverticulitis in the sigmoid colon with a moderate amount of extraluminal air which tracks superiorly along the  vascular pedicle. No definitive abscess is noted at this time. The more proximal colon is within normal limits. The appendix is unremarkable. The stomach and small bowel are within normal limits. Vascular/Lymphatic: Aortic atherosclerosis. No enlarged abdominal or pelvic lymph nodes. Reproductive: Prostate is unremarkable. Other: No abdominal wall hernia or abnormality. No  abdominopelvic ascites. Musculoskeletal: No acute or significant osseous findings. IMPRESSION: Changes consistent with diverticulitis in the sigmoid colon with likely diverticular rupture and air extending along the vascular pedicle of mesentery. Electronically Signed   By: Inez Catalina M.D.   On: 11/07/2018 09:49    Anti-infectives: Anti-infectives (From admission, onward)   Start     Dose/Rate Route Frequency Ordered Stop   11/07/18 1430  piperacillin-tazobactam (ZOSYN) IVPB 3.375 g     3.375 g 12.5 mL/hr over 240 Minutes Intravenous Every 8 hours 11/07/18 1105     11/07/18 1015  piperacillin-tazobactam (ZOSYN) IVPB 3.375 g     3.375 g 100 mL/hr over 30 Minutes Intravenous  Once 11/07/18 1003 11/07/18 1046      Assessment/Plan: Problem List: Patient Active Problem List   Diagnosis Date Noted  . Sigmoid diverticulitis 11/07/2018    Continue clear liquids;  CT abdomen scheduled for Monday * No surgery found *    LOS: 2 days   Matt B. Hassell Done, MD, [facility] [phone] beeper [phone]  11/09/2018 8:19 AM

## 2018-11-10 NOTE — Plan of Care (Signed)
  Problem: Education: Goal: Knowledge of General Education information will improve Description Including pain rating scale, medication(s)/side effects and non-pharmacologic comfort measures Outcome: Progressing   

## 2018-11-10 NOTE — Progress Notes (Signed)
Patient ID: John Morse, male   DOB: 01/09/1956, 63 y.o.   MRN: [id number] [facility] Surgery Progress Note:   * No surgery found *  Subjective: Mental status is clear;  Discussed followup with Dr. Cristina Gong who is his GI doc Objective: Vital signs in last 24 hours: Temp:  [98 F (36.7 C)-98.3 F (36.8 C)] 98.3 F (36.8 C) (07/05 0418) Pulse Rate:  [66-83] 66 (07/05 0418) Resp:  [17-22] 22 (07/05 0418) BP: (139-155)/(74-81) 143/74 (07/05 0418) SpO2:  [98 %-100 %] 98 % (07/05 0418)  Intake/Output from previous day: 07/04 0701 - 07/05 0700 In: 7095.7 [P.O.:580; I.V.:6215.1; IV Piggyback:300.6] Out: -  Intake/Output this shift: No intake/output data recorded.  Physical Exam: Work of breathing is normal.  Pain better  Lab Results:  No results found for this or any previous visit (from the past 48 hour(s)).  Radiology/Results: No results found.  Anti-infectives: Anti-infectives (From admission, onward)   Start     Dose/Rate Route Frequency Ordered Stop   11/07/18 1430  piperacillin-tazobactam (ZOSYN) IVPB 3.375 g     3.375 g 12.5 mL/hr over 240 Minutes Intravenous Every 8 hours 11/07/18 1105     11/07/18 1015  piperacillin-tazobactam (ZOSYN) IVPB 3.375 g     3.375 g 100 mL/hr over 30 Minutes Intravenous  Once 11/07/18 1003 11/07/18 1046      Assessment/Plan: Problem List: Patient Active Problem List   Diagnosis Date Noted  . Sigmoid diverticulitis 11/07/2018    Plan repeat CT in AM;  Will check CBC in AM as well.  He clinically is doing better  * No surgery found *    LOS: 3 days   Matt B. Hassell Done, MD, [facility] [phone] beeper [phone]  11/10/2018 9:11 AM

## 2018-11-11 ENCOUNTER — Inpatient Hospital Stay (HOSPITAL_COMMUNITY): Payer: BC Managed Care – PPO

## 2018-11-11 ENCOUNTER — Encounter (HOSPITAL_COMMUNITY): Payer: Self-pay | Admitting: Radiology

## 2018-11-11 LAB — CBC WITH DIFFERENTIAL/PLATELET
Abs Immature Granulocytes: 0.04 10*3/uL (ref 0.00–0.07)
Basophils Absolute: 0 10*3/uL (ref 0.0–0.1)
Basophils Relative: 0 %
Eosinophils Absolute: 0.2 10*3/uL (ref 0.0–0.5)
Eosinophils Relative: 3 %
HCT: 43.6 % (ref 39.0–52.0)
Hemoglobin: 15.3 g/dL (ref 13.0–17.0)
Immature Granulocytes: 1 %
Lymphocytes Relative: 18 %
Lymphs Abs: 1.1 10*3/uL (ref 0.7–4.0)
MCH: 30.2 pg (ref 26.0–34.0)
MCHC: 35.1 g/dL (ref 30.0–36.0)
MCV: 86 fL (ref 80.0–100.0)
Monocytes Absolute: 0.8 10*3/uL (ref 0.1–1.0)
Monocytes Relative: 13 %
Neutro Abs: 3.9 10*3/uL (ref 1.7–7.7)
Neutrophils Relative %: 65 %
Platelets: 182 10*3/uL (ref 150–400)
RBC: 5.07 MIL/uL (ref 4.22–5.81)
RDW: 13.5 % (ref 11.5–15.5)
WBC: 6 10*3/uL (ref 4.0–10.5)
nRBC: 0 % (ref 0.0–0.2)

## 2018-11-11 MED ORDER — AMOXICILLIN-POT CLAVULANATE 875-125 MG PO TABS
1.0000 | ORAL_TABLET | Freq: Two times a day (BID) | ORAL | Status: DC
  Administered 2018-11-11 – 2018-11-12 (×3): 1 via ORAL
  Filled 2018-11-11 (×4): qty 1

## 2018-11-11 MED ORDER — IOHEXOL 300 MG/ML  SOLN
125.0000 mL | Freq: Once | INTRAMUSCULAR | Status: AC | PRN
  Administered 2018-11-11: 100 mL via INTRAVENOUS

## 2018-11-11 NOTE — Progress Notes (Signed)
[facility] Progress Note     Subjective: CC-  Up in chair this morning. Asking when he can go home. Abdominal pain much improved. States that he is still a little sore in the LLQ, but he is not taking any pain medication. Denies n/v. Tolerating clear liquids without increased pain. He had about 4 loose, non-bloody BMs yesterday.  CT scan today shows acute sigmoid diverticulitis with perforation with a new 2cm adjacent diverticular abscess; evidence of extravasation of a small amount of contrast from the colon at the site of perforation; increased inflammatory changes in the mesentery since the prior study and slight decrease in the air in the mesentery.  Objective: Vital signs in last 24 hours: Temp:  [98.4 F (36.9 C)-98.7 F (37.1 C)] 98.4 F (36.9 C) (07/06 0517) Pulse Rate:  [68-71] 68 (07/06 0517) Resp:  [18] 18 (07/06 0517) BP: (138-149)/(74-82) 138/74 (07/06 0517) SpO2:  [96 %-98 %] 98 % (07/06 0517) Last BM Date: 11/10/18  Intake/Output from previous day: 07/05 0701 - 07/06 0700 In: 4578 [P.O.:1537; I.V.:2891.6; IV Piggyback:149.4] Out: -  Intake/Output this shift: Total I/O In: 500 [P.O.:500] Out: -   PE: Gen:  Alert, NAD, pleasant HEENT: EOM's intact, pupils equal and round Card:  RRR Pulm:  CTAB, no W/R/R, effort normal Abd: Soft, ND, +BS, no HSM, mild LLQ TTP without rebound or guarding Ext:  Calves soft and nontender without edema Psych: A&Ox3  Skin: no rashes noted, warm and dry  Lab Results:  Recent Labs    11/11/18 0255  WBC 6.0  HGB 15.3  HCT 43.6  PLT 182   BMET No results for input(s): NA, K, CL, CO2, GLUCOSE, BUN, CREATININE, CALCIUM in the last 72 hours. PT/INR No results for input(s): LABPROT, INR in the last 72 hours. CMP     Component Value Date/Time   NA 136 11/08/2018 0330   K 2.8 (L) 11/08/2018 0330   CL 97 (L) 11/08/2018 0330   CO2 27 11/08/2018 0330   GLUCOSE 87 11/08/2018 0330   BUN 12 11/08/2018 0330   CREATININE 1.38 (H) 11/08/2018 0330   CALCIUM 8.5 (L) 11/08/2018 0330   PROT 7.0 11/07/2018 0832   ALBUMIN 4.1 11/07/2018 0832   AST 23 11/07/2018 0832   ALT 30 11/07/2018 0832   ALKPHOS 41 11/07/2018 0832   BILITOT 1.7 (H) 11/07/2018 0832   GFRNONAA 55 (L) 11/08/2018 0330   GFRAA >60 11/08/2018 0330   Lipase     Component Value Date/Time   LIPASE 25 11/07/2018 0832       Studies/Results: Ct Abdomen Pelvis W Contrast  Result Date: 11/11/2018 CLINICAL DATA:  Abdominal pain.  Perforated diverticulitis. EXAM: CT ABDOMEN AND PELVIS WITH CONTRAST TECHNIQUE: Multidetector CT imaging of the abdomen and pelvis was performed using the standard protocol following bolus administration of intravenous contrast. CONTRAST:  181mL OMNIPAQUE IOHEXOL 300 MG/ML  SOLN COMPARISON:  CT scan dated 11/07/2018 FINDINGS: Lower chest: Normal. Hepatobiliary: Mild hepatic steatosis.  Biliary tree is normal. Pancreas: Unremarkable. No pancreatic ductal dilatation or surrounding inflammatory changes. Spleen: Normal in size without focal abnormality. Adrenals/Urinary Tract: Normal adrenal glands. 12 mm simple appearing cyst on the lower pole of the right kidney. Kidneys are otherwise normal. No hydronephrosis. Bladder is normal. Stomach/Bowel: Again noted is acute sigmoid diverticulitis with a focal perforation. Air is present in the mesentery extending superiorly posterior to the pancreas and posterior to the fundus of the stomach. The amount of air has slightly diminished. There is increased  haziness in the mesentery. The patient now has a 2 cm poorly defined diverticular abscess. There is a small focal area of extravasated contrast at the site of perforation best seen on image 71 of series 7 and image 67 of series 3. There are only a few diverticula in the sigmoid portion of the colon. The bowel otherwise appears normal. Vascular/Lymphatic: Aortic atherosclerosis. No enlarged abdominal or pelvic lymph nodes. Reproductive:  Prostate is unremarkable. Other: Small bilateral inguinal hernias containing only fat. Musculoskeletal: No acute or significant osseous findings. IMPRESSION: 1. Acute sigmoid diverticulitis with perforation. There is now a small adjacent diverticular abscess. There is evidence of extravasation of a small amount of contrast from the colon at the site of perforation. Increased inflammatory changes in the mesentery since the prior study. 2. Slight decrease in the air in the mesentery. Air extends into the upper abdomen as described above. 3. Electronically Signed   By: Lorriane Shire M.D.   On: 11/11/2018 06:47    Anti-infectives: Anti-infectives (From admission, onward)   Start     Dose/Rate Route Frequency Ordered Stop   11/07/18 1430  piperacillin-tazobactam (ZOSYN) IVPB 3.375 g     3.375 g 12.5 mL/hr over 240 Minutes Intravenous Every 8 hours 11/07/18 1105     11/07/18 1015  piperacillin-tazobactam (ZOSYN) IVPB 3.375 g     3.375 g 100 mL/hr over 30 Minutes Intravenous  Once 11/07/18 1003 11/07/18 1046       Assessment/Plan HTN HLD Pre-DM managing with diet/exercise Psoriatic arthritis - on Cosentyx  Sigmoid diverticulitis with perforation - 1st bout of diverticulitis - last colonoscopy 2019 by Dr. Cristina Gong, per patient it was normal - CT 7/2 shows diverticulitis in the sigmoid colon with likely diverticular rupture and air extending along the vascular pedicle of mesentery - CT scan 7/6 shows acute sigmoid diverticulitis with perforation with a new 2cm adjacent diverticular abscess; evidence of extravasation of a small amount of contrast from the colon at the site of perforation; increased inflammatory changes in the mesentery since the prior study and slight decrease in the air in the mesentery  ID - zosyn 7/2>> VTE - SCDs, lovenox FEN - decrease IVF, FLD Foley - none Follow up - Dr. Cristina Gong  Plan - Clinically patient is significantly improved, WBC WNL, VSS. CT scan does show a  new small 2cm abscess and contrast extrav at the site of perforation. Will review CT scan with MD. Madaline Brilliant for full liquids. Continue IV zosyn.   LOS: 4 days    Wellington Hampshire , [facility] 11/11/2018, 10:25 AM Pager: [phone] Mon-Thurs 7:00 am-4:30 pm Fri 7:00 am -11:30 AM Sat-Sun 7:00 am-11:30 am

## 2018-11-11 NOTE — Discharge Instructions (Addendum)
Diverticulitis  Diverticulitis is when small pockets in your large intestine (colon) get infected or swollen. This causes stomach pain and watery poop (diarrhea). These pouches are called diverticula. They form in people who have a condition called diverticulosis. Follow these instructions at home: Medicines  Take over-the-counter and prescription medicines only as told by your doctor. These include: ? Antibiotics. ? Pain medicines. ? Fiber pills. ? Probiotics. ? Stool softeners.  Do not drive or use heavy machinery while taking prescription pain medicine.  If you were prescribed an antibiotic, take it as told. Do not stop taking it even if you feel better. General instructions   Follow a diet as told by your doctor.  When you feel better, your doctor may tell you to change your diet. You may need to eat a lot of fiber. Fiber makes it easier to poop (have bowel movements). Healthy foods with fiber include: ? Berries. ? Beans. ? Lentils. ? Green vegetables.  Exercise 3 or more times a week. Aim for 30 minutes each time. Exercise enough to sweat and make your heart beat faster.  Keep all follow-up visits as told. This is important. You may need to have an exam of the large intestine. This is called a colonoscopy. Contact a doctor if:  Your pain does not get better.  You have a hard time eating or drinking.  You are not pooping like normal. Get help right away if:  Your pain gets worse.  Your problems do not get better.  Your problems get worse very fast.  You have a fever.  You throw up (vomit) more than one time.  You have poop that is: ? Bloody. ? Black. ? Tarry. Summary  Diverticulitis is when small pockets in your large intestine (colon) get infected or swollen.  Take medicines only as told by your doctor.  Follow a diet as told by your doctor. This information is not intended to replace advice given to you by your health care provider. Make sure you  discuss any questions you have with your health care provider. Document Released: 10/11/2007 Document Revised: 04/06/2017 Document Reviewed: 05/11/2016 Elsevier Patient Education  [place].    Low-Fiber Eating Plan Fiber is found in fruits, vegetables, whole grains, and beans. Eating a diet low in fiber helps to reduce how often you have bowel movements and how much you produce during a bowel movement. A low-fiber eating plan may help your digestive system heal if:  You have certain conditions, such as Crohn's disease or diverticulitis.  You recently had radiation therapy on your pelvis or bowel.  You recently had intestinal surgery.  You have a new surgical opening in your abdomen (colostomy or ileostomy).  Your intestine is narrowed (stricture). Your health care provider will determine how long you need to stay on this diet. Your health care provider may recommend that you work with a diet and nutrition specialist (dietitian). What are tips for following this plan? General guidelines  Follow recommendations from your dietitian about how much fiber you should have each day.  Most people on this eating plan should try to eat less than 10 grams (g) of fiber each day. Your daily fiber goal is _________________ g.  Take vitamin and mineral supplements as told by your health care provider or dietitian. Chewable or liquid forms are best when on this eating plan. Reading food labels  Check food labels for the amount of dietary fiber.  Choose foods that have less than 2 grams of  fiber in one serving. Cooking  Use white flour and other allowed grains for baking and cooking.  Cook meat using methods that keep it tender, such as braising or poaching.  Cook eggs until the yolk is completely solid.  Cook with healthy oils, such as olive oil or canola oil. Meal planning   Eat 5-6 small meals throughout the day instead of 3 large meals.  If you are lactose  intolerant: ? Choose low-lactose dairy foods. ? Do not eat dairy foods, if told by your dietitian.  Limit fat and oils to less than 8 teaspoons a day.  Eat small portions of desserts. What foods are allowed? The items listed below may not be a complete list. Talk with your dietitian about what dietary choices are best for you. Grains All bread and crackers made with white flour. Waffles, pancakes, and [place] toast. Bagels. Pretzels. Melba toast, zwieback, and matzoh. Cooked and dried cereals that do not contain whole grains, added fiber, seeds, or dried fruit. CornmealDomenick Gong. Hot and cold cereals made with refined corn, wheat, rice, or oats. Plain pasta and noodles. White rice. Vegetables Well-cooked or canned vegetables without skin, seeds, or stems. Cooked potatoes without skins. Vegetable juice. Fruits Soft-cooked or canned fruits without skin and seeds. Peeled ripe banana. Applesauce. Fruit juice without pulp. Meats and other protein foods Ground meat. Tender cuts of meat or poultry. Eggs. Fish, seafood, and shellfish. Smooth nut butters. Tofu. Dairy All milk products and drinks. Lactose-free milks, including rice, soy, and almond milks. Yogurt without fruit, nuts, chocolate, or granola mix-ins. Sour cream. Cottage cheese. Cheese. Beverages Decaf coffee. Fruit and vegetable juices or smoothies (in small amounts, with no pulp or skins, and with fruits from allowed list). Sports drinks. Herbal tea. Fats and oils Olive oil, canola oil, sunflower oil, flaxseed oil, and grapeseed oil. Mayonnaise. Cream cheese. Margarine. Butter. Sweets and desserts Plain cakes and cookies. Cream pies and pies made with allowed fruits. Pudding. Custard. Fruit gelatin. Sherbet. Popsicles. Ice cream without nuts. Plain hard candy. Honey. Jelly. Molasses. Syrups, including chocolate syrup. Chocolate. Marshmallows. Gumdrops. Seasoning and other foods Bouillon. Broth. Cream soups made from allowed foods.  Strained soup. Casseroles made with allowed foods. Ketchup. Mild mustard. Mild salad dressings. Plain gravies. Vinegar. Spices in moderation. Salt. Sugar. What foods are not allowed? The items listed below may not be a complete list. Talk with your dietitian about what dietary choices are best for you. Grains Whole wheat and whole grain breads and crackers. Multigrain breads and crackers. Rye bread. Whole grain or multigrain cereals. Cereals with nuts, raisins, or coconut. Bran. Coarse wheat cereals. Granola. High-fiber cereals. Cornmeal or corn bread. Whole grain pasta. Wild or brown rice. Quinoa. Popcorn. Buckwheat. Wheat germ. Vegetables Potato skins. Raw or undercooked vegetables. All beans and bean sprouts. Cooked greens. Corn. Peas. Cabbage. Beets. Broccoli. Brussels sprouts. Cauliflower. Mushrooms. Onions. Peppers. Parsnips. Okra. Sauerkraut. Fruit Raw or dried fruit. Berries. Fruit juice with pulp. Prune juice. Meats and other protein foods Tough, fibrous meats with gristle. Fatty meat. Poultry with skin. Fried meat, Sales executive, or fish. Deli or lunch meats. Sausage, bacon, and hot dogs. Nuts and chunky nut butter. Dried peas, beans, and lentils. Dairy Yogurt with fruit, nuts, chocolate, or granola mix-ins. Beverages Caffeinated coffee and teas. Fats and oils Avocado. Coconut. Sweets and desserts Desserts, cookies, or candies that contain nuts or coconut. Dried fruit. Jams and preserves with seeds. Marmalade. Any dessert made with fruits or grains that are not allowed. Seasoning and other  foods Corn tortilla chips. Soups made with vegetables or grains that are not allowed. Relish. Horseradish. Angie Morse. Olives. Summary  Most people on a low-fiber eating plan should eat less than 10 grams of fiber a day. Follow recommendations from your dietitian about how much fiber you should have each day.  Always check food labels to see the dietary fiber content of packaged foods. In general, a  low-fiber food will have fewer than 2 grams of fiber per serving.  In general, try to avoid whole grains, raw fruits and vegetables, dried fruit, tough cuts of meat, nuts, and seeds.  Take a vitamin and mineral supplement as told by your health care provider or dietitian. This information is not intended to replace advice given to you by your health care provider. Make sure you discuss any questions you have with your health care provider. Document Released: 10/14/2001 Document Revised: 08/16/2018 Document Reviewed: 06/27/2016 Elsevier Patient Education  [place].    High-Fiber Diet Fiber, also called dietary fiber, is a type of carbohydrate that is found in fruits, vegetables, whole grains, and beans. A high-fiber diet can have many health benefits. Your health care provider may recommend a high-fiber diet to help:  Prevent constipation. Fiber can make your bowel movements more regular.  Lower your cholesterol.  Relieve the following conditions: ? Swelling of veins in the anus (hemorrhoids). ? Swelling and irritation (inflammation) of specific areas of the digestive tract (uncomplicated diverticulosis). ? A problem of the large intestine (colon) that sometimes causes pain and diarrhea (irritable bowel syndrome, IBS).  Prevent overeating as part of a weight-loss plan.  Prevent heart disease, type 2 diabetes, and certain cancers. What is my plan? The recommended daily fiber intake in grams (g) includes:  38 g for men age 14 or younger.  30 g for men over age 37.  76 g for women age 38 or younger.  21 g for women over age 80. You can get the recommended daily intake of dietary fiber by:  Eating a variety of fruits, vegetables, grains, and beans.  Taking a fiber supplement, if it is not possible to get enough fiber through your diet. What do I need to know about a high-fiber diet?  It is better to get fiber through food sources rather than from fiber supplements.  There is not a lot of research about how effective supplements are.  Always check the fiber content on the nutrition facts label of any prepackaged food. Look for foods that contain 5 g of fiber or more per serving.  Talk with a diet and nutrition specialist (dietitian) if you have questions about specific foods that are recommended or not recommended for your medical condition, especially if those foods are not listed below.  Gradually increase how much fiber you consume. If you increase your intake of dietary fiber too quickly, you may have bloating, cramping, or gas.  Drink plenty of water. Water helps you to digest fiber. What are tips for following this plan?  Eat a wide variety of high-fiber foods.  Make sure that half of the grains that you eat each day are whole grains.  Eat breads and cereals that are made with whole-grain flour instead of refined flour or white flour.  Eat brown rice, bulgur wheat, or millet instead of white rice.  Start the day with a breakfast that is high in fiber, such as a cereal that contains 5 g of fiber or more per serving.  Use beans in place of  meat in soups, salads, and pasta dishes.  Eat high-fiber snacks, such as berries, raw vegetables, nuts, and popcorn.  Choose whole fruits and vegetables instead of processed forms like juice or sauce. What foods can I eat?  Fruits Berries. Pears. Apples. Oranges. Avocado. Prunes and raisins. Dried figs. Vegetables Sweet potatoes. Spinach. Kale. Artichokes. Cabbage. Broccoli. Cauliflower. Green peas. Carrots. Squash. Grains Whole-grain breads. Multigrain cereal. Oats and oatmeal. Brown rice. Barley. Bulgur wheat. [place]. Quinoa. Bran muffins. Popcorn. Rye wafer crackers. Meats and other proteins [place], kidney, and pinto beans. Soybeans. Split peas. Lentils. Nuts and seeds. Dairy Fiber-fortified yogurt. Beverages Fiber-fortified soy milk. Fiber-fortified orange juice. Other foods Fiber bars. The items  listed above may not be a complete list of recommended foods and beverages. Contact a dietitian for more options. What foods are not recommended? Fruits Fruit juice. Cooked, strained fruit. Vegetables Fried potatoes. Canned vegetables. Well-cooked vegetables. Grains White bread. Pasta made with refined flour. White rice. Meats and other proteins Fatty cuts of meat. Fried chicken or fried fish. Dairy Milk. Yogurt. Cream cheese. Sour cream. Fats and oils Butters. Beverages Soft drinks. Other foods Cakes and pastries. The items listed above may not be a complete list of foods and beverages to avoid. Contact a dietitian for more information. Summary  Fiber is a type of carbohydrate. It is found in fruits, vegetables, whole grains, and beans.  There are many health benefits of eating a high-fiber diet, such as preventing constipation, lowering blood cholesterol, helping with weight loss, and reducing your risk of heart disease, diabetes, and certain cancers.  Gradually increase your intake of fiber. Increasing too fast can result in cramping, bloating, and gas. Drink plenty of water while you increase your fiber.  The best sources of fiber include whole fruits and vegetables, whole grains, nuts, seeds, and beans. This information is not intended to replace advice given to you by your health care provider. Make sure you discuss any questions you have with your health care provider. Document Released: 04/24/2005 Document Revised: 02/26/2017 Document Reviewed: 02/26/2017 Elsevier Patient Education  2020 [employer].

## 2018-11-12 MED ORDER — AMOXICILLIN-POT CLAVULANATE 875-125 MG PO TABS: 1.0000 | ORAL_TABLET | Freq: Two times a day (BID) | ORAL | 0 refills | Status: DC

## 2018-11-12 NOTE — Discharge Summary (Signed)
[facility] Discharge Summary   Patient ID: John Morse MRN: [id number] DOB/AGE: [birth year] 63 y.o.  Admit date: 11/07/2018 Discharge date: 11/12/2018  Admitting Diagnosis: Sigmoid diverticulitis with perforation  Discharge Diagnosis Patient Active Problem List   Diagnosis Date Noted  . Sigmoid diverticulitis 11/07/2018    Consultants None  Imaging: Ct Abdomen Pelvis W Contrast  Result Date: 11/11/2018 CLINICAL DATA:  Abdominal pain.  Perforated diverticulitis. EXAM: CT ABDOMEN AND PELVIS WITH CONTRAST TECHNIQUE: Multidetector CT imaging of the abdomen and pelvis was performed using the standard protocol following bolus administration of intravenous contrast. CONTRAST:  138mL OMNIPAQUE IOHEXOL 300 MG/ML  SOLN COMPARISON:  CT scan dated 11/07/2018 FINDINGS: Lower chest: Normal. Hepatobiliary: Mild hepatic steatosis.  Biliary tree is normal. Pancreas: Unremarkable. No pancreatic ductal dilatation or surrounding inflammatory changes. Spleen: Normal in size without focal abnormality. Adrenals/Urinary Tract: Normal adrenal glands. 12 mm simple appearing cyst on the lower pole of the right kidney. Kidneys are otherwise normal. No hydronephrosis. Bladder is normal. Stomach/Bowel: Again noted is acute sigmoid diverticulitis with a focal perforation. Air is present in the mesentery extending superiorly posterior to the pancreas and posterior to the fundus of the stomach. The amount of air has slightly diminished. There is increased haziness in the mesentery. The patient now has a 2 cm poorly defined diverticular abscess. There is a small focal area of extravasated contrast at the site of perforation best seen on image 71 of series 7 and image 67 of series 3. There are only a few diverticula in the sigmoid portion of the colon. The bowel otherwise appears normal. Vascular/Lymphatic: Aortic atherosclerosis. No enlarged abdominal or pelvic lymph nodes. Reproductive: Prostate is unremarkable.  Other: Small bilateral inguinal hernias containing only fat. Musculoskeletal: No acute or significant osseous findings. IMPRESSION: 1. Acute sigmoid diverticulitis with perforation. There is now a small adjacent diverticular abscess. There is evidence of extravasation of a small amount of contrast from the colon at the site of perforation. Increased inflammatory changes in the mesentery since the prior study. 2. Slight decrease in the air in the mesentery. Air extends into the upper abdomen as described above. 3. Electronically Signed   By: Lorriane Shire M.D.   On: 11/11/2018 06:47    Procedures None  Hospital Course:  John Morse is a 63yo male PMH HTN, HLD, pre-DM managing with diet/exercise, who presented to [facility] 7/2 with worsening abdominal pain.  Workup included CT scan which showed diverticulitis in the sigmoid colon with likely diverticular rupture and air extending along the vascular pedicle of mesentery.  WBC up, lactate normal, vitals normal, and no peritonitis on exam therefore patient was admitted for conservative management with bowel rest and IV zosyn. WBC normalized and patient clinically improved therefore diet was slowly advanced. He underwent repeat CT scan 7/6 that showed some progression of inflammation in the mesentery, but he continued to clinically improve. He was transitioned to oral antibiotics on 7/6. On 7/7 the patient was voiding well, tolerating diet, having bowel function, abdominal pain resolved, vital signs stable and felt stable for discharge home.  He will go home on 10 days of augmentin to complete a 14 day course, and will follow up with Dr. Cristina Gong his gastroenterologist as well as colorectal surgeon Dr. Johney Maine. Patient knows to call with questions or concerns.     Physical Exam: Gen:  Alert, NAD, pleasant HEENT: EOM's intact, pupils equal and round Card:  RRR Pulm:  CTAB, no W/R/R, effort normal Abd: Soft, ND, +BS, no HSM,  nontender Ext:  Calves soft and  nontender without edema Psych: A&Ox3  Skin: no rashes noted, warm and dry   Allergies as of 11/12/2018      Reactions   Other Anaphylaxis   Embrel knots at the injection site   Avapro [irbesartan] Cough   Codeine Nausea And Vomiting   Lisinopril Cough   Otezla [apremilast] Cough      Medication List    TAKE these medications   amoxicillin-clavulanate 875-125 MG tablet Commonly known as: AUGMENTIN Take 1 tablet by mouth every 12 (twelve) hours.   aspirin 81 MG chewable tablet Chew 81 mg by mouth daily.   atenolol-chlorthalidone 50-25 MG tablet Commonly known as: TENORETIC Take 1 tablet by mouth daily.   cetirizine 10 MG tablet Commonly known as: ZYRTEC Take 10 mg by mouth daily.   Cosentyx Sensoready (300 MG) 150 MG/ML Soaj Generic drug: Secukinumab (300 MG Dose) Inject 300 mg as directed every 30 (thirty) days. (2) 150 mg injections   ferrous sulfate 324 MG Tbec Take 324 mg by mouth daily with breakfast.   FISH OIL PO Take by mouth.   folic acid 876 MCG tablet Commonly known as: FOLVITE Take 400 mcg by mouth daily.   ipratropium 0.06 % nasal spray Commonly known as: ATROVENT Place 2 sprays into both nostrils as needed for rhinitis.   pravastatin 40 MG tablet Commonly known as: PRAVACHOL Take 40 mg by mouth daily.   vitamin C 1000 MG tablet Take 1,000 mg by mouth daily.   vitamin E 400 UNIT capsule Take 400 Units by mouth daily.        Follow-up Information    Ronald Lobo, MD. Call.   Specialty: Gastroenterology Why: call to arrange follow up with your gastroenterologist to discuss colonscopy Contact information: [address] [place] [address] [phone]        Michael Boston, MD. Go on 12/03/2018.   Specialty: General Surgery Why: Your appointment is 12/03/2018 at 9:30 am Please arrive 30 minutes prior to your appointment to check in and fill out paperwork. Bring photo ID and insurance information. Contact  information: [address] [place] [address] [phone]           Signed: Wellington Hampshire, [facility] 11/12/2018, 9:15 AM Pager: [phone] Mon-Thurs 7:00 am-4:30 pm Fri 7:00 am -11:30 AM Sat-Sun 7:00 am-11:30 am

## 2018-11-12 NOTE — Progress Notes (Signed)
Patient discharged to home with instructions. 

## 2018-11-29 DIAGNOSIS — E291 Testicular hypofunction: Secondary | ICD-10-CM | POA: Diagnosis not present

## 2018-12-03 DIAGNOSIS — K5721 Diverticulitis of large intestine with perforation and abscess with bleeding: Secondary | ICD-10-CM | POA: Diagnosis not present

## 2018-12-04 ENCOUNTER — Ambulatory Visit: Payer: Self-pay | Admitting: Surgery

## 2018-12-04 NOTE — H&P (Signed)
BARNARD SHARPS Documented: 12/03/2018 9:43 AM Location: [facility] Patient #: [id number] DOB: December 14, 1955 Married / Language: English / Race: White Male   History of Present Illness Adin Hector MD; 12/03/2018 1:45 PM) The patient is a 63 year old male who presents with diverticulitis. Note for "Diverticulitis": ` ` ` Patient sent for surgical consultation at the request of Dr Hassell Done  Chief Complaint: Diverticulitis with recent hospitalization ` ` The patient is a gentleman who was admitted with diverticulitis and microperforation. Follow-up CAT scan showed a small abscess but decreased information. His transition to oral antibiotics. His hospital 5 days, discharged seventh of July. Follow up with one of his colorectal surgeons was recommended. Patient had some recurrent symptoms off his oral antibiotics. That was renewed until we could see him. He notes he's feeling better today. No more fevers. Finishing his second 5 day course today. He has some borderline prediabetes managed without medication. Psoriatic arthritis on biologic immunosuppressant, Secukinumab (Cosentyx). Apparently had a colonoscopy last year by Dr. Cristina Gong with [facility] gastroenterology. Claims it was normal.  No personal nor family history of GI/colon cancer, inflammatory bowel disease, irritable bowel syndrome, allergy such as Celiac Sprue, dietary/dairy problems, colitis, ulcers nor gastritis. No recent sick contacts/gastroenteritis. No travel outside the country. No changes in diet. No dysphagia to solids or liquids. No significant heartburn or reflux. No hematochezia, hematemesis, coffee ground emesis. No evidence of prior gastric/peptic ulceration. Patient usually moves his bowels about 4 times a day. 3 in the morning. Using was well formed and they're somewhat loose. He had a vasectomy but no other abdominal other surgeries. He does not smoke. He bicycles up to 30 miles at a time.  Had a workup for rectal bleeding last year was negative EGD and colonoscopy and capsule endoscopy according to his gastroenterologist. Randall Hiss comes today with his wife. He has a lot of questions about diverticulosis/diverticulitis in diet. Surgery.  .  (Review of systems as stated in this history (HPI) or in the review of systems. Otherwise all other 12 point ROS are negative) ` ` `  Diagnosis 1. Colon, segmental resection, rectosigmoid - SEGMENT OF COLORECTUM (19 CM) WITH MILD CONGESTION AND REACTIVE CHANGES, COMPATIBLE WITH CLINICALLY STATED PROLAPSE - NEGATIVE FOR DYSPLASIA OR MALIGNANCY - MARGINS APPEAR VIABLE 2. Colon, resection margin (donut), distal margin - COLONIC DONUT WITHIN NORMAL LIMITS Jaquita Folds MD Pathologist, Electronic Signature (Case signed 11/07/2018)   Past Surgical History (Tanisha A. Owens Shark, [place]; 12/03/2018 9:43 AM) Foot Surgery  Left. Knee Surgery  Right. Shoulder Surgery  Bilateral. Tonsillectomy  Vasectomy   Diagnostic Studies History (Tanisha A. Owens Shark, [place]; 12/03/2018 9:43 AM) Colonoscopy  1-5 years ago  Allergies (Tanisha A. Owens Shark, [place]; 12/03/2018 9:43 AM) No Known Drug Allergies  [12/03/2018]: Allergies Reconciled   Medication History (Tanisha A. Owens Shark, [place]; 12/03/2018 9:44 AM) Atenolol-Chlorthalidone (50-25MG  Tablet, Oral) Active. Cetirizine HCl (10MG  Tablet, Oral) Active. Pravastatin Sodium (40MG  Tablet, Oral) Active. Meloxicam (15MG  Tablet, Oral) Active. Medications Reconciled  Social History (Tanisha A. Owens Shark, [place]; 12/03/2018 9:43 AM) Alcohol use  Occasional alcohol use. Caffeine use  Carbonated beverages. Illicit drug use  Remotely quit drug use. Tobacco use  Former smoker.  Family History (Tanisha A. Owens Shark, [place]; 12/03/2018 9:43 AM) Alcohol Abuse  Father. Arthritis  Family Members In General. Diabetes Mellitus  Father. Thyroid problems  Mother.  Other Problems (Tanisha A. Owens Shark, [place]; 12/03/2018 9:43 AM)  Arthritis  Diabetes Mellitus  High blood pressure  Hypercholesterolemia     Review of Systems (  Tanisha A. Brown RMA; 12/03/2018 9:43 AM) General Present- Weight Loss. Not Present- Appetite Loss, Chills, Fatigue, Fever, Night Sweats and Weight Gain. Skin Present- Dryness. Not Present- Change in Wart/Mole, Hives, Jaundice, New Lesions, Non-Healing Wounds, Rash and Ulcer. HEENT Present- Hearing Loss, Seasonal Allergies and Wears glasses/contact lenses. Not Present- Earache, Hoarseness, Nose Bleed, Oral Ulcers, Ringing in the Ears, Sinus Pain, Sore Throat, Visual Disturbances and Yellow Eyes. Gastrointestinal Present- Abdominal Pain. Not Present- Bloating, Bloody Stool, Change in Bowel Habits, Chronic diarrhea, Constipation, Difficulty Swallowing, Excessive gas, Gets full quickly at meals, Hemorrhoids, Indigestion, Nausea, Rectal Pain and Vomiting. Male Genitourinary Not Present- Blood in Urine, Change in Urinary Stream, Frequency, Impotence, Nocturia, Painful Urination, Urgency and Urine Leakage. Musculoskeletal Present- Joint Pain. Not Present- Back Pain, Joint Stiffness, Muscle Pain, Muscle Weakness and Swelling of Extremities. Neurological Not Present- Decreased Memory, Fainting, Headaches, Numbness, Seizures, Tingling, Tremor, Trouble walking and Weakness. Psychiatric Not Present- Anxiety, Bipolar, Change in Sleep Pattern, Depression, Fearful and Frequent crying. Endocrine Not Present- Cold Intolerance, Excessive Hunger, Hair Changes, Heat Intolerance, Hot flashes and New Diabetes. Hematology Not Present- Blood Thinners, Easy Bruising, Excessive bleeding, Gland problems, HIV and Persistent Infections.  Vitals (Tanisha A. Brown RMA; 12/03/2018 9:43 AM) 12/03/2018 9:43 AM Weight: 220.6 lb Height: 72in Body Surface Area: 2.22 m Body Mass Index: 29.92 kg/m  Temp.: 97.40F  Pulse: 85 (Regular)  BP: 126/86(Sitting, Left Arm, Standard)       Physical Exam Adin Hector MD;  12/03/2018 10:27 AM) General Mental Status-Alert. General Appearance-Not in acute distress, Not Sickly. Orientation-Oriented X3. Hydration-Well hydrated. Voice-Normal.  Integumentary Global Assessment Upon inspection and palpation of skin surfaces of the - Axillae: non-tender, no inflammation or ulceration, no drainage. and Distribution of scalp and body hair is normal. General Characteristics Temperature - normal warmth is noted.  Head and Neck Head-normocephalic, atraumatic with no lesions or palpable masses. Face Global Assessment - atraumatic, no absence of expression. Neck Global Assessment - no abnormal movements, no bruit auscultated on the right, no bruit auscultated on the left, no decreased range of motion, non-tender. Trachea-midline. Thyroid Gland Characteristics - non-tender.  Eye Eyeball - Left-Extraocular movements intact, No Nystagmus - Left. Eyeball - Right-Extraocular movements intact, No Nystagmus - Right. Cornea - Left-No Hazy - Left. Cornea - Right-No Hazy - Right. Sclera/Conjunctiva - Left-No scleral icterus, No Discharge - Left. Sclera/Conjunctiva - Right-No scleral icterus, No Discharge - Right. Pupil - Left-Direct reaction to light normal. Pupil - Right-Direct reaction to light normal.  ENMT Ears Pinna - Left - no drainage observed, no generalized tenderness observed. Pinna - Right - no drainage observed, no generalized tenderness observed. Nose and Sinuses External Inspection of the Nose - no destructive lesion observed. Inspection of the nares - Left - quiet respiration. Inspection of the nares - Right - quiet respiration. Mouth and Throat Lips - Upper Lip - no fissures observed, no pallor noted. Lower Lip - no fissures observed, no pallor noted. Nasopharynx - no discharge present. Oral Cavity/Oropharynx - Tongue - no dryness observed. Oral Mucosa - no cyanosis observed. Hypopharynx - no evidence of airway distress  observed.  Chest and Lung Exam Inspection Movements - Normal and Symmetrical. Accessory muscles - No use of accessory muscles in breathing. Palpation Palpation of the chest reveals - Non-tender. Auscultation Breath sounds - Normal and Clear.  Cardiovascular Auscultation Rhythm - Regular. Murmurs & Other Heart Sounds - Auscultation of the heart reveals - No Murmurs and No Systolic Clicks.  Abdomen Inspection Inspection of the  abdomen reveals - No Visible peristalsis and No Abnormal pulsations. Umbilicus - No Bleeding, No Urine drainage. Palpation/Percussion Palpation and Percussion of the abdomen reveal - Soft, Non Tender, No Rebound tenderness, No Rigidity (guarding) and No Cutaneous hyperesthesia. Note: Abdomen soft. Nontender. Not distended. No umbilical or incisional hernias. No guarding.   Male Genitourinary Sexual Maturity Tanner 5 - Adult hair pattern and Adult penile size and shape. Note: Right inguinal hernia on Valsalva only. Possible left inguinal hernia as well. Otherwise normal external male genitalia   Peripheral Vascular Upper Extremity Inspection - Left - No Cyanotic nailbeds - Left, Not Ischemic. Inspection - Right - No Cyanotic nailbeds - Right, Not Ischemic.  Neurologic Neurologic evaluation reveals -normal attention span and ability to concentrate, able to name objects and repeat phrases. Appropriate fund of knowledge , normal sensation and normal coordination. Mental Status Affect - not angry, not paranoid. Cranial Nerves-Normal Bilaterally. Gait-Normal.  Neuropsychiatric Mental status exam performed with findings of-able to articulate well with normal speech/language, rate, volume and coherence, thought content normal with ability to perform basic computations and apply abstract reasoning and no evidence of hallucinations, delusions, obsessions or homicidal/suicidal ideation.  Musculoskeletal Global Assessment Spine, Ribs and Pelvis - no  instability, subluxation or laxity. Right Upper Extremity - no instability, subluxation or laxity.  Lymphatic Head & Neck  General Head & Neck Lymphatics: Bilateral - Description - No Localized lymphadenopathy. Axillary  General Axillary Region: Bilateral - Description - No Localized lymphadenopathy. Femoral & Inguinal  Generalized Femoral & Inguinal Lymphatics: Left - Description - No Localized lymphadenopathy. Right - Description - No Localized lymphadenopathy.    Assessment & Plan Adin Hector MD; 12/03/2018 1:47 PM)  DIVERTICULITIS OF LARGE INTESTINE WITH PERFORATION AND ABSCESS WITH BLEEDING (K57.21) Impression: Significant perforation and phlegmon consistent with diverticulitis in a patient that is chronically immunosuppressed for start arthritis. 5 day hospitalization. Recurrent symptoms improved Augmentin.  My instincts is that this is a complicated attack any immunosuppressed patient and he is high risk for recurrence. He still may require repeat CT scan abscess drainage if he has persistent symptoms after the second course of antibiotics.  Ideally we can cool this down in & plan robotic sigmoid colectomy with immediate anastomosis while his immunosuppression is being held to lower the risk of wound infection or abscess or hernia. I long discussion with the patient and his wife.  He seemed disappointed and frustrated. He is hoping to avoid surgery. I do not recommend that. Discuss with my colorectal partners, Dr. Dema Severin & Marcello Moores, who agree.  It is reasonable to transition back into high fiber diet with a fiber supplement every day. He been on a bland diet. See how that goes. Information along with some menus was offered.  We will lean towards scheduling for surgery as his wife wishes. He may cancel if not. I don't want to wait another 3-6 months, but it he will think about things and let us know  Current Plans Pt Education - [facility] Diverticular Disease (AT) Pt Education - [place] (Ceferino Lang) Pt Education - [facility] Fiber (AT)  PREOP COLON - ENCOUNTER FOR PREOPERATIVE EXAMINATION FOR GENERAL SURGICAL PROCEDURE (Z01.818)  Current Plans You are being scheduled for surgery- Our schedulers will call you.  You should hear from our office's scheduling department within 5 working days about the location, date, and time of surgery. We try to make accommodations for patient's preferences in scheduling surgery, but sometimes the OR schedule or the surgeon's schedule prevents  [place] from making those accommodations.  If you have not heard from our office [phone]) in 5 working days, call the office and ask for your surgeon's nurse.  If you have other questions about your diagnosis, plan, or surgery, call the office and ask for your surgeon's nurse.  Written instructions provided The anatomy & physiology of the digestive tract was discussed. The pathophysiology of the colon was discussed. Natural history risks without surgery was discussed. I feel the risks of no intervention will lead to serious problems that outweigh the operative risks; therefore, I recommended a partial colectomy to remove the pathology. Minimally invasive (Robotic/Laparoscopic) & open techniques were discussed.  Risks such as bleeding, infection, abscess, leak, reoperation, possible ostomy, hernia, heart attack, death, and other risks were discussed. I noted a good likelihood this will help address the problem. Goals of post-operative recovery were discussed as well. Need for adequate nutrition, daily bowel regimen and healthy physical activity, to optimize recovery was noted as well. We will work to minimize complications. Educational materials were available as well. Questions were answered. The patient expresses understanding & wishes to proceed with surgery.  Pt Education - [facility] Colon Bowel Prep 2018 ERAS/Miralax/Antibiotics Started Neomycin Sulfate 500 MG Oral Tablet, 2 (two) Tablet  SEE NOTE, #6, 12/03/2018, No Refill. Local Order: Pharmacist Notes: TAKE TWO TABLETS AT 2 PM, 3 PM, AND 10 PM THE DAY PRIOR TO SURGERY Started Flagyl 500 MG Oral Tablet, 2 (two) Tablet SEE NOTE, #6, 12/03/2018, No Refill. Local Order: Pharmacist Notes: Take at 2pm, 3pm, and 10pm the day prior to your colon operation Pt Education - Pamphlet Given - Laparoscopic Colorectal Surgery: discussed with patient and provided information. Pt Education - [facility] Colectomy post-op instructions: discussed with patient and provided information.  Adin Hector, MD, FACS, MASCRS Gastrointestinal and Minimally Invasive Surgery    [address], [place], [place] [address] [phone] Main / Paging [phone] Fax

## 2018-12-06 DIAGNOSIS — K5792 Diverticulitis of intestine, part unspecified, without perforation or abscess without bleeding: Secondary | ICD-10-CM | POA: Diagnosis not present

## 2018-12-06 DIAGNOSIS — R935 Abnormal findings on diagnostic imaging of other abdominal regions, including retroperitoneum: Secondary | ICD-10-CM | POA: Diagnosis not present

## 2018-12-26 DIAGNOSIS — E291 Testicular hypofunction: Secondary | ICD-10-CM | POA: Diagnosis not present

## 2018-12-26 DIAGNOSIS — E1169 Type 2 diabetes mellitus with other specified complication: Secondary | ICD-10-CM | POA: Diagnosis not present

## 2018-12-26 DIAGNOSIS — I10 Essential (primary) hypertension: Secondary | ICD-10-CM | POA: Diagnosis not present

## 2018-12-26 DIAGNOSIS — Z125 Encounter for screening for malignant neoplasm of prostate: Secondary | ICD-10-CM | POA: Diagnosis not present

## 2018-12-26 DIAGNOSIS — E782 Mixed hyperlipidemia: Secondary | ICD-10-CM | POA: Diagnosis not present

## 2018-12-31 DIAGNOSIS — Z0000 Encounter for general adult medical examination without abnormal findings: Secondary | ICD-10-CM | POA: Diagnosis not present

## 2019-01-15 NOTE — Patient Instructions (Addendum)
DUE TO COVID-19 ONLY ONE VISITOR IS ALLOWED TO COME WITH YOU AND STAY IN THE WAITING ROOM ONLY DURING PRE OP AND PROCEDURE DAY OF SURGERY. THE 1 VISITOR MAY VISIT WITH YOU AFTER SURGERY IN YOUR PRIVATE ROOM DURING VISITING HOURS ONLY!  YOU NEED TO HAVE A COVID 19 TEST ON___9-12-2020____ @___10 :30AM____, THIS TEST MUST BE DONE BEFORE SURGERY, COME  [address] [place] , [id number].  ([place]) ONCE YOUR COVID TEST IS COMPLETED, PLEASE BEGIN THE QUARANTINE INSTRUCTIONS AS OUTLINED IN YOUR HANDOUT.                John Morse   Your procedure is scheduled on: 01-22-2019   Report to [facility] Main  Entrance   Report to admitting at 11:15AM     Call this number if you have problems the morning of surgery [phone]    PLEASE FOLLOW ALL BOWEL PREP INSTRUCTIONS PROVIDED BY YOUR SURGEON!   DRINK PLENTY OF CLEAR LIQUIDS THE DAY BEFORE SURGERY . DRINK (2) PRESURGERY GATORADE DRINKS THE NIGHT BEFORE SURGERY AT 10:00 PM.  YOU MAY CONTINUE DRINKING CLEAR LIQUIDS UNTIL YOU [place] THE FINAL GATORADE AT __10:15AM___ . NOTHING BY MOUTH AFTER THE LAST GATORADE!    CLEAR LIQUID DIET   Foods Allowed                                                                     Foods Excluded  Coffee and tea, regular and decaf                             liquids that you cannot  Plain Jell-O any favor except red or purple                                           see through such as: Fruit ices (not with fruit pulp)                                     milk, soups, orange juice  Iced Popsicles                                    All solid food Carbonated beverages, regular and diet                                    Cranberry, grape and apple juices Sports drinks like Gatorade Lightly seasoned clear broth or consume(fat free) Sugar, honey syrup  Sample Menu Breakfast                                Lunch  Supper Cranberry juice                     Beef broth                            Chicken broth Jell-O                                     Grape juice                           Apple juice Coffee or tea                        Jell-O                                      Popsicle                                                Coffee or tea                        Coffee or tea  _____________________________________________________________________   BRUSH YOUR TEETH MORNING OF SURGERY AND RINSE YOUR MOUTH OUT, NO CHEWING GUM CANDY OR MINTS.     Take these medicines the morning of surgery with A SIP OF WATER: ZYRTEC, PRAVASTATIN                                  You may not have any metal on your body including hair pins and              piercings  Do not wear jewelry, make-up, lotions, powders or perfumes, deodorant                         Men may shave face and neck.   Do not bring valuables to the hospital. Agency.  Contacts, dentures or bridgework may not be worn into surgery.                Please read over the following fact sheets you were given: _____________________________________________________________________             [facility] - Preparing for Surgery Before surgery, you can play an important role.  Because skin is not sterile, your skin needs to be as free of germs as possible.  You can reduce the number of germs on your skin by washing with CHG (chlorahexidine gluconate) soap before surgery.  CHG is an antiseptic cleaner which kills germs and bonds with the skin to continue killing germs even after washing. Please DO NOT use if you have an allergy to CHG or antibacterial soaps.  If your skin becomes reddened/irritated stop using the CHG and inform your nurse when you arrive at Short Stay. Do not shave (including legs and underarms) for at least 48 hours prior  to the first CHG shower.  You may shave your face/neck. Please follow these instructions  carefully:  1.  Shower with CHG Soap the night before surgery and the  morning of Surgery.  2.  If you choose to wash your hair, wash your hair first as usual with your  normal  shampoo.  3.  After you shampoo, rinse your hair and body thoroughly to remove the  shampoo.                           4.  Use CHG as you would any other liquid soap.  You can apply chg directly  to the skin and wash                       Gently with a scrungie or clean washcloth.  5.  Apply the CHG Soap to your body ONLY FROM THE NECK DOWN.   Do not use on face/ open                           Wound or open sores. Avoid contact with eyes, ears mouth and genitals (private parts).                       Wash face,  Genitals (private parts) with your normal soap.             6.  Wash thoroughly, paying special attention to the area where your surgery  will be performed.  7.  Thoroughly rinse your body with warm water from the neck down.  8.  DO NOT shower/wash with your normal soap after using and rinsing off  the CHG Soap.                9.  Pat yourself dry with a clean towel.            10.  Wear clean pajamas.            11.  Place clean sheets on your bed the night of your first shower and do not  sleep with pets. Day of Surgery : Do not apply any lotions/deodorants the morning of surgery.  Please wear clean clothes to the hospital/surgery center.  FAILURE TO FOLLOW THESE INSTRUCTIONS MAY RESULT IN THE CANCELLATION OF YOUR SURGERY PATIENT SIGNATURE_________________________________  NURSE SIGNATURE__________________________________  ________________________________________________________________________   John Morse  An incentive spirometer is a tool that can help keep your lungs clear and active. This tool measures how well you are filling your lungs with each breath. Taking long deep breaths may help reverse or decrease the chance of developing breathing (pulmonary) problems (especially infection)  following:  A long period of time when you are unable to move or be active. BEFORE THE PROCEDURE   If the spirometer includes an indicator to show your best effort, your nurse or respiratory therapist will set it to a desired goal.  If possible, sit up straight or lean slightly forward. Try not to slouch.  Hold the incentive spirometer in an upright position. INSTRUCTIONS FOR USE  1. Sit on the edge of your bed if possible, or sit up as far as you can in bed or on a chair. 2. Hold the incentive spirometer in an upright position. 3. Breathe out normally. 4. Place the mouthpiece in your mouth and seal your lips tightly around it. 5. Breathe in  slowly and as deeply as possible, raising the piston or the ball toward the top of the column. 6. Hold your breath for 3-5 seconds or for as long as possible. Allow the piston or ball to fall to the bottom of the column. 7. Remove the mouthpiece from your mouth and breathe out normally. 8. Rest for a few seconds and repeat Steps 1 through 7 at least 10 times every 1-2 hours when you are awake. Take your time and take a few normal breaths between deep breaths. 9. The spirometer may include an indicator to show your best effort. Use the indicator as a goal to work toward during each repetition. 10. After each set of 10 deep breaths, practice coughing to be sure your lungs are clear. If you have an incision (the cut made at the time of surgery), support your incision when coughing by placing a pillow or rolled up towels firmly against it. Once you are able to get out of bed, walk around indoors and cough well. You may stop using the incentive spirometer when instructed by your caregiver.  RISKS AND COMPLICATIONS  Take your time so you do not get dizzy or light-headed.  If you are in pain, you may need to take or ask for pain medication before doing incentive spirometry. It is harder to take a deep breath if you are having pain. AFTER USE  Rest and  breathe slowly and easily.  It can be helpful to keep track of a log of your progress. Your caregiver can provide you with a simple table to help with this. If you are using the spirometer at home, follow these instructions: [place] IF:   You are having difficultly using the spirometer.  You have trouble using the spirometer as often as instructed.  Your pain medication is not giving enough relief while using the spirometer.  You develop fever of 100.5 F (38.1 C) or higher. SEEK IMMEDIATE MEDICAL CARE IF:   You cough up bloody sputum that had not been present before.  You develop fever of 102 F (38.9 C) or greater.  You develop worsening pain at or near the incision site. MAKE SURE YOU:   Understand these instructions.  Will watch your condition.  Will get help right away if you are not doing well or get worse. Document Released: 09/04/2006 Document Revised: 07/17/2011 Document Reviewed: 11/05/2006 [facility] Patient Information 2014 [place], [place].   ________________________________________________________________________

## 2019-01-16 ENCOUNTER — Encounter (HOSPITAL_COMMUNITY): Payer: Self-pay

## 2019-01-16 ENCOUNTER — Other Ambulatory Visit: Payer: Self-pay

## 2019-01-16 ENCOUNTER — Encounter (HOSPITAL_COMMUNITY)
Admission: RE | Admit: 2019-01-16 | Discharge: 2019-01-16 | Disposition: A | Discharge status: Home / Self Care | Payer: BC Managed Care – PPO | Source: Ambulatory Visit | Attending: Surgery | Admitting: Surgery

## 2019-01-16 DIAGNOSIS — I10 Essential (primary) hypertension: Secondary | ICD-10-CM | POA: Insufficient documentation

## 2019-01-16 DIAGNOSIS — Z01818 Encounter for other preprocedural examination: Secondary | ICD-10-CM | POA: Diagnosis not present

## 2019-01-16 HISTORY — DX: Other specified abnormal findings of blood chemistry: R79.89

## 2019-01-16 LAB — BASIC METABOLIC PANEL
Anion gap: 15 (ref 5–15)
BUN: 15 mg/dL (ref 6–20)
CO2: 28 mmol/L (ref 22–32)
Calcium: 9.6 mg/dL (ref 8.9–10.3)
Chloride: 97 mmol/L — ABNORMAL LOW (ref 98–111)
Creatinine, Ser: 0.97 mg/dL (ref 0.61–1.24)
GFR calc Af Amer: >60 mL/min (ref >60)
GFR calc non Af Amer: >60 mL/min (ref >60)
Glucose, Bld: 123 mg/dL — ABNORMAL HIGH (ref 70–99)
Potassium: 3.9 mmol/L (ref 3.5–5.1)
Sodium: 140 mmol/L (ref 135–145)

## 2019-01-16 LAB — CBC
HCT: 45.4 % (ref 39.0–52.0)
Hemoglobin: 15.6 g/dL (ref 13.0–17.0)
MCH: 29.1 pg (ref 26.0–34.0)
MCHC: 34.4 g/dL (ref 30.0–36.0)
MCV: 84.5 fL (ref 80.0–100.0)
Platelets: 200 10*3/uL (ref 150–400)
RBC: 5.37 MIL/uL (ref 4.22–5.81)
RDW: 13.8 % (ref 11.5–15.5)
WBC: 8.2 10*3/uL (ref 4.0–10.5)
nRBC: 0 % (ref 0.0–0.2)

## 2019-01-16 LAB — ABO/RH: ABO/RH(D): A POS

## 2019-01-16 NOTE — Progress Notes (Signed)
PCP - Mayra Neer, MD Cardiologist -   Chest x-ray -  EKG -  Stress Test -  ECHO -  Cardiac Cath -   Sleep Study -  CPAP -   Fasting Blood Sugar -  Checks Blood Sugar _____ times a day  Blood Thinner Instructions: Aspirin Instructions: ASA last done to be 01-17-2019 Last Dose:  Anesthesia review:   Patient denies shortness of breath, fever, cough and chest pain at PAT appointment   Patient verbalized understanding of instructions that were given to them at the PAT appointment. Patient was also instructed that they will need to review over the PAT instructions again at home before surgery.

## 2019-01-16 NOTE — Consult Note (Signed)
[place] Nurse requested for preoperative stoma site marking  Discussed surgical procedure and stoma creation with patient.  Explained role of the [place] nurse team.  Provided the patient with educational booklet and provided samples of pouching options.  Answered patient questions.   Examined patient sitting and standing in order to place the marking in the patient's visual field, away from any creases or abdominal contour issues and within the rectus muscle.  Attempted to mark above the patient's belt line.   Marked for colostomy in the LUQ  __5__ cm to the left of the umbilicus and even with  the umbilicus.  Patient's abdomen cleansed with CHG wipes at site markings, allowed to air dry prior to marking.Covered mark with thin film transparent dressing to preserve mark until date of surgery.  Val Riles, RN, MSN, CWOCN, CNS-BC, pager [phone]

## 2019-01-17 ENCOUNTER — Other Ambulatory Visit (HOSPITAL_COMMUNITY): Payer: Self-pay | Admitting: Emergency Medicine

## 2019-01-17 LAB — HEMOGLOBIN A1C
Hgb A1c MFr Bld: 6.3 % — ABNORMAL HIGH (ref 4.8–5.6)
Mean Plasma Glucose: 134 mg/dL

## 2019-01-18 ENCOUNTER — Other Ambulatory Visit (HOSPITAL_COMMUNITY)
Admission: RE | Admit: 2019-01-18 | Discharge: 2019-01-18 | Disposition: A | Discharge status: Home / Self Care | Payer: BC Managed Care – PPO | Source: Ambulatory Visit | Attending: Surgery | Admitting: Surgery

## 2019-01-18 DIAGNOSIS — Z01812 Encounter for preprocedural laboratory examination: Secondary | ICD-10-CM | POA: Diagnosis not present

## 2019-01-18 DIAGNOSIS — Z20828 Contact with and (suspected) exposure to other viral communicable diseases: Secondary | ICD-10-CM | POA: Diagnosis not present

## 2019-01-19 LAB — NOVEL CORONAVIRUS, NAA (HOSP ORDER, SEND-OUT TO REF LAB; TAT 18-24 HRS): SARS-CoV-2, NAA: NOT DETECTED

## 2019-01-21 MED ORDER — BUPIVACAINE LIPOSOME 1.3 % IJ SUSP
20.0000 mL | Freq: Once | INTRAMUSCULAR | Status: DC
  Filled 2019-01-21: qty 20

## 2019-01-21 MED ORDER — SODIUM CHLORIDE 0.9 % IV SOLN
INTRAVENOUS | Status: DC
  Filled 2019-01-21: qty 6

## 2019-01-22 ENCOUNTER — Encounter (HOSPITAL_COMMUNITY): Payer: Self-pay | Admitting: Emergency Medicine

## 2019-01-22 ENCOUNTER — Other Ambulatory Visit: Payer: Self-pay

## 2019-01-22 ENCOUNTER — Encounter (HOSPITAL_COMMUNITY)
Admission: RE | Disposition: A | Discharge status: Home / Self Care | Payer: Self-pay | Source: Home / Self Care | Attending: Surgery

## 2019-01-22 ENCOUNTER — Inpatient Hospital Stay (HOSPITAL_COMMUNITY): Payer: BC Managed Care – PPO | Admitting: Physician Assistant

## 2019-01-22 ENCOUNTER — Inpatient Hospital Stay (HOSPITAL_COMMUNITY): Payer: BC Managed Care – PPO | Admitting: Certified Registered Nurse Anesthetist

## 2019-01-22 ENCOUNTER — Inpatient Hospital Stay (HOSPITAL_COMMUNITY)
Admission: RE | Admit: 2019-01-22 | Discharge: 2019-01-24 | DRG: 334 | Disposition: A | Discharge status: Home / Self Care | Payer: BC Managed Care – PPO | Attending: Surgery | Admitting: Surgery

## 2019-01-22 DIAGNOSIS — E785 Hyperlipidemia, unspecified: Secondary | ICD-10-CM | POA: Diagnosis present

## 2019-01-22 DIAGNOSIS — Z87891 Personal history of nicotine dependence: Secondary | ICD-10-CM | POA: Diagnosis not present

## 2019-01-22 DIAGNOSIS — D84821 Immunodeficiency due to drugs: Secondary | ICD-10-CM

## 2019-01-22 DIAGNOSIS — Z20828 Contact with and (suspected) exposure to other viral communicable diseases: Secondary | ICD-10-CM | POA: Diagnosis not present

## 2019-01-22 DIAGNOSIS — E876 Hypokalemia: Secondary | ICD-10-CM | POA: Diagnosis not present

## 2019-01-22 DIAGNOSIS — Z791 Long term (current) use of non-steroidal anti-inflammatories (NSAID): Secondary | ICD-10-CM

## 2019-01-22 DIAGNOSIS — M1990 Unspecified osteoarthritis, unspecified site: Secondary | ICD-10-CM | POA: Diagnosis not present

## 2019-01-22 DIAGNOSIS — K5732 Diverticulitis of large intestine without perforation or abscess without bleeding: Secondary | ICD-10-CM | POA: Diagnosis present

## 2019-01-22 DIAGNOSIS — Z833 Family history of diabetes mellitus: Secondary | ICD-10-CM | POA: Diagnosis not present

## 2019-01-22 DIAGNOSIS — K5720 Diverticulitis of large intestine with perforation and abscess without bleeding: Secondary | ICD-10-CM | POA: Diagnosis not present

## 2019-01-22 DIAGNOSIS — Z7982 Long term (current) use of aspirin: Secondary | ICD-10-CM

## 2019-01-22 DIAGNOSIS — Z811 Family history of alcohol abuse and dependence: Secondary | ICD-10-CM | POA: Diagnosis not present

## 2019-01-22 DIAGNOSIS — K4020 Bilateral inguinal hernia, without obstruction or gangrene, not specified as recurrent: Secondary | ICD-10-CM | POA: Diagnosis present

## 2019-01-22 DIAGNOSIS — K5721 Diverticulitis of large intestine with perforation and abscess with bleeding: Secondary | ICD-10-CM | POA: Diagnosis not present

## 2019-01-22 DIAGNOSIS — L4050 Arthropathic psoriasis, unspecified: Secondary | ICD-10-CM | POA: Diagnosis present

## 2019-01-22 DIAGNOSIS — K650 Generalized (acute) peritonitis: Secondary | ICD-10-CM | POA: Diagnosis not present

## 2019-01-22 DIAGNOSIS — R7303 Prediabetes: Secondary | ICD-10-CM | POA: Diagnosis not present

## 2019-01-22 DIAGNOSIS — Z792 Long term (current) use of antibiotics: Secondary | ICD-10-CM

## 2019-01-22 DIAGNOSIS — Z79899 Other long term (current) drug therapy: Secondary | ICD-10-CM | POA: Diagnosis not present

## 2019-01-22 DIAGNOSIS — I10 Essential (primary) hypertension: Secondary | ICD-10-CM | POA: Diagnosis present

## 2019-01-22 HISTORY — PX: PROCTOSCOPY: SHX2266

## 2019-01-22 HISTORY — DX: Arthropathic psoriasis, unspecified: L40.50

## 2019-01-22 LAB — TYPE AND SCREEN
ABO/RH(D): A POS
Antibody Screen: NEGATIVE

## 2019-01-22 LAB — GLUCOSE, CAPILLARY: Glucose-Capillary: 131 mg/dL — ABNORMAL HIGH (ref 70–99)

## 2019-01-22 SURGERY — COLECTOMY, PARTIAL, ROBOT-ASSISTED, LAPAROSCOPIC
Anesthesia: General

## 2019-01-22 MED ORDER — MIDAZOLAM HCL 5 MG/5ML IJ SOLN
INTRAMUSCULAR | Status: DC | PRN
  Administered 2019-01-22: 2 mg via INTRAVENOUS

## 2019-01-22 MED ORDER — LACTATED RINGERS IV SOLN
INTRAVENOUS | Status: DC
  Administered 2019-01-22 (×3): via INTRAVENOUS

## 2019-01-22 MED ORDER — EPHEDRINE SULFATE-NACL 50-0.9 MG/10ML-% IV SOSY
PREFILLED_SYRINGE | INTRAVENOUS | Status: DC | PRN
  Administered 2019-01-22 (×3): 10 mg via INTRAVENOUS

## 2019-01-22 MED ORDER — PRAVASTATIN SODIUM 20 MG PO TABS
40.0000 mg | ORAL_TABLET | Freq: Every day | ORAL | Status: DC
  Administered 2019-01-23 – 2019-01-24 (×2): 40 mg via ORAL
  Filled 2019-01-22 (×2): qty 2

## 2019-01-22 MED ORDER — MAGIC MOUTHWASH
15.0000 mL | Freq: Four times a day (QID) | ORAL | Status: DC | PRN
  Filled 2019-01-22: qty 15

## 2019-01-22 MED ORDER — HYDROMORPHONE HCL 1 MG/ML IJ SOLN
0.5000 mg | INTRAMUSCULAR | Status: DC | PRN
  Administered 2019-01-22: 1 mg via INTRAVENOUS
  Filled 2019-01-22: qty 2

## 2019-01-22 MED ORDER — FENTANYL CITRATE (PF) 100 MCG/2ML IJ SOLN
INTRAMUSCULAR | Status: DC | PRN
  Administered 2019-01-22 (×3): 100 ug via INTRAVENOUS
  Administered 2019-01-22: 50 ug via INTRAVENOUS

## 2019-01-22 MED ORDER — DIPHENHYDRAMINE HCL 50 MG/ML IJ SOLN: 12.5000 mg | Freq: Four times a day (QID) | INTRAMUSCULAR | Status: DC | PRN

## 2019-01-22 MED ORDER — FOLIC ACID 1 MG PO TABS
500.0000 ug | ORAL_TABLET | Freq: Every day | ORAL | Status: DC
  Administered 2019-01-23 – 2019-01-24 (×2): 0.5 mg via ORAL
  Filled 2019-01-22 (×2): qty 1

## 2019-01-22 MED ORDER — ASPIRIN 81 MG PO CHEW
81.0000 mg | CHEWABLE_TABLET | Freq: Every day | ORAL | Status: DC
  Administered 2019-01-23 – 2019-01-24 (×2): 81 mg via ORAL
  Filled 2019-01-22 (×2): qty 1

## 2019-01-22 MED ORDER — HYDROMORPHONE HCL 1 MG/ML IJ SOLN
0.2500 mg | INTRAMUSCULAR | Status: DC | PRN
  Administered 2019-01-22 (×2): 0.5 mg via INTRAVENOUS

## 2019-01-22 MED ORDER — OXYCODONE HCL 5 MG PO TABS: 5.0000 mg | ORAL_TABLET | Freq: Once | ORAL | Status: DC | PRN

## 2019-01-22 MED ORDER — PROCHLORPERAZINE EDISYLATE 10 MG/2ML IJ SOLN: 5.0000 mg | Freq: Four times a day (QID) | INTRAMUSCULAR | Status: DC | PRN

## 2019-01-22 MED ORDER — ENSURE SURGERY PO LIQD
237.0000 mL | Freq: Two times a day (BID) | ORAL | Status: DC
  Administered 2019-01-23 – 2019-01-24 (×3): 237 mL via ORAL
  Filled 2019-01-22 (×4): qty 237

## 2019-01-22 MED ORDER — TRAMADOL HCL 50 MG PO TABS
50.0000 mg | ORAL_TABLET | Freq: Four times a day (QID) | ORAL | Status: DC | PRN
  Administered 2019-01-23: 50 mg via ORAL
  Filled 2019-01-22: qty 1

## 2019-01-22 MED ORDER — LIP MEDEX EX OINT
1.0000 "application " | TOPICAL_OINTMENT | Freq: Two times a day (BID) | CUTANEOUS | Status: DC
  Administered 2019-01-22 – 2019-01-24 (×4): 1 via TOPICAL
  Filled 2019-01-22 (×2): qty 7

## 2019-01-22 MED ORDER — HYDRALAZINE HCL 20 MG/ML IJ SOLN: 10.0000 mg | INTRAMUSCULAR | Status: DC | PRN

## 2019-01-22 MED ORDER — GABAPENTIN 300 MG PO CAPS
300.0000 mg | ORAL_CAPSULE | ORAL | Status: AC
  Administered 2019-01-22: 300 mg via ORAL
  Filled 2019-01-22: qty 1

## 2019-01-22 MED ORDER — HYDROMORPHONE HCL 1 MG/ML IJ SOLN
INTRAMUSCULAR | Status: AC
  Administered 2019-01-22: 0.5 mg via INTRAVENOUS
  Filled 2019-01-22: qty 1

## 2019-01-22 MED ORDER — PHENYLEPHRINE 40 MCG/ML (10ML) SYRINGE FOR IV PUSH (FOR BLOOD PRESSURE SUPPORT)
PREFILLED_SYRINGE | INTRAVENOUS | Status: DC | PRN
  Administered 2019-01-22: 120 ug via INTRAVENOUS

## 2019-01-22 MED ORDER — ONDANSETRON HCL 4 MG/2ML IJ SOLN
4.0000 mg | Freq: Four times a day (QID) | INTRAMUSCULAR | Status: DC | PRN
  Administered 2019-01-22 – 2019-01-23 (×2): 4 mg via INTRAVENOUS
  Filled 2019-01-22 (×2): qty 2

## 2019-01-22 MED ORDER — ENOXAPARIN SODIUM 40 MG/0.4ML ~~LOC~~ SOLN
40.0000 mg | Freq: Once | SUBCUTANEOUS | Status: AC
  Administered 2019-01-22: 40 mg via SUBCUTANEOUS
  Filled 2019-01-22: qty 0.4

## 2019-01-22 MED ORDER — KETOROLAC TROMETHAMINE 30 MG/ML IJ SOLN: 30.0000 mg | Freq: Once | INTRAMUSCULAR | Status: DC | PRN

## 2019-01-22 MED ORDER — LORATADINE 10 MG PO TABS
10.0000 mg | ORAL_TABLET | Freq: Every day | ORAL | Status: DC
  Administered 2019-01-23 – 2019-01-24 (×2): 10 mg via ORAL
  Filled 2019-01-22 (×2): qty 1

## 2019-01-22 MED ORDER — SACCHAROMYCES BOULARDII 250 MG PO CAPS
250.0000 mg | ORAL_CAPSULE | Freq: Two times a day (BID) | ORAL | Status: DC
  Administered 2019-01-22 – 2019-01-24 (×4): 250 mg via ORAL
  Filled 2019-01-22 (×4): qty 1

## 2019-01-22 MED ORDER — ONDANSETRON HCL 4 MG/2ML IJ SOLN
INTRAMUSCULAR | Status: DC | PRN
  Administered 2019-01-22: 4 mg via INTRAVENOUS

## 2019-01-22 MED ORDER — CELECOXIB 200 MG PO CAPS
200.0000 mg | ORAL_CAPSULE | ORAL | Status: AC
  Administered 2019-01-22: 200 mg via ORAL
  Filled 2019-01-22: qty 1

## 2019-01-22 MED ORDER — PROPOFOL 10 MG/ML IV BOLUS
INTRAVENOUS | Status: AC
  Filled 2019-01-22: qty 20

## 2019-01-22 MED ORDER — BISACODYL 5 MG PO TBEC: 20.0000 mg | DELAYED_RELEASE_TABLET | Freq: Once | ORAL | Status: DC

## 2019-01-22 MED ORDER — PROPOFOL 10 MG/ML IV BOLUS
INTRAVENOUS | Status: DC | PRN
  Administered 2019-01-22: 180 mg via INTRAVENOUS

## 2019-01-22 MED ORDER — ENSURE PRE-SURGERY PO LIQD
296.0000 mL | Freq: Once | ORAL | Status: DC
  Filled 2019-01-22: qty 296

## 2019-01-22 MED ORDER — LACTATED RINGERS IR SOLN
Status: DC | PRN
  Administered 2019-01-22: 1

## 2019-01-22 MED ORDER — ATENOLOL 50 MG PO TABS
50.0000 mg | ORAL_TABLET | Freq: Once | ORAL | Status: AC
  Administered 2019-01-22: 50 mg via ORAL
  Filled 2019-01-22: qty 1

## 2019-01-22 MED ORDER — SUGAMMADEX SODIUM 200 MG/2ML IV SOLN
INTRAVENOUS | Status: DC | PRN
  Administered 2019-01-22: 200 mg via INTRAVENOUS

## 2019-01-22 MED ORDER — ALVIMOPAN 12 MG PO CAPS
12.0000 mg | ORAL_CAPSULE | Freq: Two times a day (BID) | ORAL | Status: DC
  Administered 2019-01-23: 12 mg via ORAL
  Filled 2019-01-22: qty 1

## 2019-01-22 MED ORDER — CILIDINIUM-CHLORDIAZEPOXIDE 2.5-5 MG PO CAPS
1.0000 | ORAL_CAPSULE | Freq: Every day | ORAL | Status: DC | PRN
  Filled 2019-01-22: qty 1

## 2019-01-22 MED ORDER — PROMETHAZINE HCL 25 MG/ML IJ SOLN: 6.2500 mg | INTRAMUSCULAR | Status: DC | PRN

## 2019-01-22 MED ORDER — BUPIVACAINE HCL (PF) 0.25 % IJ SOLN
INTRAMUSCULAR | Status: DC | PRN
  Administered 2019-01-22: 60 mL

## 2019-01-22 MED ORDER — ONDANSETRON HCL 4 MG/2ML IJ SOLN
INTRAMUSCULAR | Status: AC
  Filled 2019-01-22: qty 2

## 2019-01-22 MED ORDER — ENSURE PRE-SURGERY PO LIQD
592.0000 mL | Freq: Once | ORAL | Status: DC
  Filled 2019-01-22: qty 592

## 2019-01-22 MED ORDER — IPRATROPIUM BROMIDE 0.06 % NA SOLN: 2.0000 | NASAL | Status: DC | PRN

## 2019-01-22 MED ORDER — BUPIVACAINE LIPOSOME 1.3 % IJ SUSP
INTRAMUSCULAR | Status: DC | PRN
  Administered 2019-01-22: 20 mL

## 2019-01-22 MED ORDER — ALUM & MAG HYDROXIDE-SIMETH 200-200-20 MG/5ML PO SUSP: 30.0000 mL | Freq: Four times a day (QID) | ORAL | Status: DC | PRN

## 2019-01-22 MED ORDER — ROCURONIUM BROMIDE 10 MG/ML (PF) SYRINGE
PREFILLED_SYRINGE | INTRAVENOUS | Status: DC | PRN
  Administered 2019-01-22: 10 mg via INTRAVENOUS
  Administered 2019-01-22: 20 mg via INTRAVENOUS
  Administered 2019-01-22: 50 mg via INTRAVENOUS

## 2019-01-22 MED ORDER — SUCCINYLCHOLINE CHLORIDE 20 MG/ML IJ SOLN
INTRAMUSCULAR | Status: DC | PRN
  Administered 2019-01-22: 100 mg via INTRAVENOUS

## 2019-01-22 MED ORDER — SODIUM CHLORIDE 0.9 % IV SOLN: Freq: Three times a day (TID) | INTRAVENOUS | Status: DC | PRN

## 2019-01-22 MED ORDER — LIDOCAINE 2% (20 MG/ML) 5 ML SYRINGE
INTRAMUSCULAR | Status: DC | PRN
  Administered 2019-01-22: 5.7 mL/h via INTRAVENOUS

## 2019-01-22 MED ORDER — NEOMYCIN SULFATE 500 MG PO TABS: 1000.0000 mg | ORAL_TABLET | ORAL | Status: DC

## 2019-01-22 MED ORDER — METRONIDAZOLE 500 MG PO TABS: 1000.0000 mg | ORAL_TABLET | ORAL | Status: DC

## 2019-01-22 MED ORDER — VITAMIN E 180 MG (400 UNIT) PO CAPS
400.0000 [IU] | ORAL_CAPSULE | Freq: Every day | ORAL | Status: DC
  Administered 2019-01-23 – 2019-01-24 (×2): 400 [IU] via ORAL
  Filled 2019-01-22 (×2): qty 1

## 2019-01-22 MED ORDER — LACTATED RINGERS IV SOLN
INTRAVENOUS | Status: DC
  Administered 2019-01-22: 19:00:00 via INTRAVENOUS

## 2019-01-22 MED ORDER — METOPROLOL TARTRATE 5 MG/5ML IV SOLN: 5.0000 mg | Freq: Four times a day (QID) | INTRAVENOUS | Status: DC | PRN

## 2019-01-22 MED ORDER — ENOXAPARIN SODIUM 40 MG/0.4ML ~~LOC~~ SOLN
40.0000 mg | SUBCUTANEOUS | Status: DC
  Administered 2019-01-23 – 2019-01-24 (×2): 40 mg via SUBCUTANEOUS
  Filled 2019-01-22 (×2): qty 0.4

## 2019-01-22 MED ORDER — DEXAMETHASONE SODIUM PHOSPHATE 10 MG/ML IJ SOLN
INTRAMUSCULAR | Status: DC | PRN
  Administered 2019-01-22: 5 mg via INTRAVENOUS

## 2019-01-22 MED ORDER — GABAPENTIN 100 MG PO CAPS
200.0000 mg | ORAL_CAPSULE | Freq: Three times a day (TID) | ORAL | Status: DC
  Administered 2019-01-22: 23:00:00 200 mg via ORAL
  Filled 2019-01-22: qty 2

## 2019-01-22 MED ORDER — SODIUM CHLORIDE 0.9 % IV SOLN
INTRAVENOUS | Status: DC | PRN
  Administered 2019-01-22: 1000 mL

## 2019-01-22 MED ORDER — VITAMIN C 500 MG PO TABS
500.0000 mg | ORAL_TABLET | Freq: Every day | ORAL | Status: DC
  Administered 2019-01-23 – 2019-01-24 (×2): 500 mg via ORAL
  Filled 2019-01-22 (×2): qty 1

## 2019-01-22 MED ORDER — BUPIVACAINE HCL (PF) 0.25 % IJ SOLN
INTRAMUSCULAR | Status: AC
  Filled 2019-01-22: qty 60

## 2019-01-22 MED ORDER — ALVIMOPAN 12 MG PO CAPS
12.0000 mg | ORAL_CAPSULE | ORAL | Status: AC
  Administered 2019-01-22: 12 mg via ORAL
  Filled 2019-01-22: qty 1

## 2019-01-22 MED ORDER — DIPHENHYDRAMINE HCL 12.5 MG/5ML PO ELIX: 12.5000 mg | ORAL_SOLUTION | Freq: Four times a day (QID) | ORAL | Status: DC | PRN

## 2019-01-22 MED ORDER — SODIUM CHLORIDE 0.9 % IV SOLN
2.0000 g | Freq: Two times a day (BID) | INTRAVENOUS | Status: AC
  Administered 2019-01-23: 2 g via INTRAVENOUS
  Filled 2019-01-22: qty 2

## 2019-01-22 MED ORDER — POLYETHYLENE GLYCOL 3350 17 GM/SCOOP PO POWD: 1.0000 | Freq: Once | ORAL | Status: DC

## 2019-01-22 MED ORDER — SODIUM CHLORIDE 0.9 % IV SOLN
2.0000 g | INTRAVENOUS | Status: AC
  Administered 2019-01-22: 2 g via INTRAVENOUS
  Filled 2019-01-22: qty 2

## 2019-01-22 MED ORDER — PROCHLORPERAZINE MALEATE 10 MG PO TABS: 10.0000 mg | ORAL_TABLET | Freq: Four times a day (QID) | ORAL | Status: DC | PRN

## 2019-01-22 MED ORDER — MIDAZOLAM HCL 2 MG/2ML IJ SOLN
INTRAMUSCULAR | Status: AC
  Filled 2019-01-22: qty 2

## 2019-01-22 MED ORDER — ACETAMINOPHEN 500 MG PO TABS
1000.0000 mg | ORAL_TABLET | ORAL | Status: AC
  Administered 2019-01-22: 1000 mg via ORAL
  Filled 2019-01-22: qty 2

## 2019-01-22 MED ORDER — ONDANSETRON HCL 4 MG PO TABS: 4.0000 mg | ORAL_TABLET | Freq: Four times a day (QID) | ORAL | Status: DC | PRN

## 2019-01-22 MED ORDER — LIDOCAINE 2% (20 MG/ML) 5 ML SYRINGE
INTRAMUSCULAR | Status: DC | PRN
  Administered 2019-01-22: 60 mg via INTRAVENOUS

## 2019-01-22 MED ORDER — SCOPOLAMINE 1 MG/3DAYS TD PT72: 1.0000 | MEDICATED_PATCH | TRANSDERMAL | Status: DC

## 2019-01-22 MED ORDER — TRIAMCINOLONE ACETONIDE 0.5 % EX CREA: TOPICAL_CREAM | Freq: Two times a day (BID) | CUTANEOUS | Status: DC | PRN

## 2019-01-22 MED ORDER — FENTANYL CITRATE (PF) 250 MCG/5ML IJ SOLN
INTRAMUSCULAR | Status: AC
  Filled 2019-01-22: qty 5

## 2019-01-22 MED ORDER — SODIUM CHLORIDE (PF) 0.9 % IJ SOLN
INTRAMUSCULAR | Status: DC | PRN
  Administered 2019-01-22: 20 mL

## 2019-01-22 MED ORDER — OXYCODONE HCL 5 MG/5ML PO SOLN: 5.0000 mg | Freq: Once | ORAL | Status: DC | PRN

## 2019-01-22 MED ORDER — 0.9 % SODIUM CHLORIDE (POUR BTL) OPTIME
TOPICAL | Status: DC | PRN
  Administered 2019-01-22: 1000 mL

## 2019-01-22 MED ORDER — KETAMINE HCL 50 MG/ML IJ SOLN
INTRAMUSCULAR | Status: DC | PRN
  Administered 2019-01-22: 30 mg via INTRAMUSCULAR

## 2019-01-22 MED ORDER — ACETAMINOPHEN 500 MG PO TABS
1000.0000 mg | ORAL_TABLET | Freq: Four times a day (QID) | ORAL | Status: DC
  Administered 2019-01-22 – 2019-01-24 (×6): 1000 mg via ORAL
  Filled 2019-01-22 (×5): qty 2

## 2019-01-22 SURGICAL SUPPLY — 100 items
APL PRP STRL LF DISP 70% ISPRP (MISCELLANEOUS) ×1
APPLIER CLIP 5 13 M/L LIGAMAX5 (MISCELLANEOUS)
APPLIER CLIP ROT 10 11.4 M/L (STAPLE)
APR CLP MED LRG 11.4X10 (STAPLE)
APR CLP MED LRG 5 ANG JAW (MISCELLANEOUS)
BLADE EXTENDED COATED 6.5IN (ELECTRODE) ×2 IMPLANT
CANNULA REDUC XI 12-8 STAPL (CANNULA) ×1
CANNULA REDUCER 12-8 DVNC XI (CANNULA) ×1 IMPLANT
CELLS DAT CNTRL 66122 CELL SVR (MISCELLANEOUS) IMPLANT
CHLORAPREP W/TINT 26 (MISCELLANEOUS) ×2 IMPLANT
CLIP APPLIE 5 13 M/L LIGAMAX5 (MISCELLANEOUS) IMPLANT
CLIP APPLIE ROT 10 11.4 M/L (STAPLE) IMPLANT
CLIP VESOLOCK LG 6/CT PURPLE (CLIP) IMPLANT
CLIP VESOLOCK MED LG 6/CT (CLIP) IMPLANT
COVER SURGICAL LIGHT HANDLE (MISCELLANEOUS) ×4 IMPLANT
COVER TIP SHEARS 8 DVNC (MISCELLANEOUS) ×1 IMPLANT
COVER TIP SHEARS 8MM DA VINCI (MISCELLANEOUS) ×1
COVER WAND RF STERILE (DRAPES) IMPLANT
DECANTER SPIKE VIAL GLASS SM (MISCELLANEOUS) ×2 IMPLANT
DEVICE TROCAR PUNCTURE CLOSURE (ENDOMECHANICALS) IMPLANT
DRAIN CHANNEL 19F RND (DRAIN) ×2 IMPLANT
DRAPE ARM DVNC X/XI (DISPOSABLE) ×4 IMPLANT
DRAPE COLUMN DVNC XI (DISPOSABLE) ×1 IMPLANT
DRAPE DA VINCI XI ARM (DISPOSABLE) ×4
DRAPE DA VINCI XI COLUMN (DISPOSABLE) ×1
DRAPE SURG IRRIG POUCH 19X23 (DRAPES) ×2 IMPLANT
DRSG OPSITE POSTOP 4X10 (GAUZE/BANDAGES/DRESSINGS) IMPLANT
DRSG OPSITE POSTOP 4X6 (GAUZE/BANDAGES/DRESSINGS) ×2 IMPLANT
DRSG OPSITE POSTOP 4X8 (GAUZE/BANDAGES/DRESSINGS) IMPLANT
DRSG TEGADERM 2-3/8X2-3/4 SM (GAUZE/BANDAGES/DRESSINGS) ×10 IMPLANT
DRSG TEGADERM 4X4.75 (GAUZE/BANDAGES/DRESSINGS) ×2 IMPLANT
ELECT PENCIL ROCKER SW 15FT (MISCELLANEOUS) ×2 IMPLANT
ELECT REM PT RETURN 15FT ADLT (MISCELLANEOUS) ×2 IMPLANT
ENDOLOOP SUT PDS II  0 18 (SUTURE)
ENDOLOOP SUT PDS II 0 18 (SUTURE) IMPLANT
EVACUATOR SILICONE 100CC (DRAIN) ×2 IMPLANT
GAUZE SPONGE 2X2 8PLY STRL LF (GAUZE/BANDAGES/DRESSINGS) ×1 IMPLANT
GAUZE SPONGE 4X4 12PLY STRL (GAUZE/BANDAGES/DRESSINGS) IMPLANT
GLOVE ECLIPSE 8.0 STRL XLNG CF (GLOVE) ×10 IMPLANT
GLOVE INDICATOR 8.0 STRL GRN (GLOVE) ×10 IMPLANT
GOWN STRL REUS W/TWL XL LVL3 (GOWN DISPOSABLE) ×10 IMPLANT
GRASPER SUT TROCAR 14GX15 (MISCELLANEOUS) ×2 IMPLANT
HOLDER FOLEY CATH W/STRAP (MISCELLANEOUS) ×2 IMPLANT
IRRIG SUCT STRYKERFLOW 2 WTIP (MISCELLANEOUS)
IRRIGATION SUCT STRKRFLW 2 WTP (MISCELLANEOUS) IMPLANT
KIT PROCEDURE DA VINCI SI (MISCELLANEOUS)
KIT PROCEDURE DVNC SI (MISCELLANEOUS) IMPLANT
KIT TURNOVER KIT A (KITS) IMPLANT
NEEDLE INSUFFLATION 14GA 120MM (NEEDLE) ×2 IMPLANT
PACK CARDIOVASCULAR III (CUSTOM PROCEDURE TRAY) ×2 IMPLANT
PACK COLON (CUSTOM PROCEDURE TRAY) ×2 IMPLANT
PAD POSITIONING PINK XL (MISCELLANEOUS) ×2 IMPLANT
PORT LAP GEL ALEXIS MED 5-9CM (MISCELLANEOUS) ×2 IMPLANT
PROTECTOR NERVE ULNAR (MISCELLANEOUS) ×4 IMPLANT
RTRCTR WOUND ALEXIS 18CM MED (MISCELLANEOUS)
SCISSORS LAP 5X35 DISP (ENDOMECHANICALS) ×2 IMPLANT
SEAL CANN UNIV 5-8 DVNC XI (MISCELLANEOUS) ×4 IMPLANT
SEAL XI 5MM-8MM UNIVERSAL (MISCELLANEOUS) ×4
SEALER VESSEL DA VINCI XI (MISCELLANEOUS) ×1
SEALER VESSEL EXT DVNC XI (MISCELLANEOUS) ×1 IMPLANT
SLEEVE ADV FIXATION 5X100MM (TROCAR) IMPLANT
SOLUTION ELECTROLUBE (MISCELLANEOUS) ×2 IMPLANT
SPONGE GAUZE 2X2 STER 10/PKG (GAUZE/BANDAGES/DRESSINGS) ×1
STAPLER 45 BLU RELOAD XI (STAPLE) IMPLANT
STAPLER 45 BLUE RELOAD XI (STAPLE)
STAPLER 45 GREEN RELOAD XI (STAPLE) ×2
STAPLER 45 GRN RELOAD XI (STAPLE) ×2 IMPLANT
STAPLER CANNULA SEAL DVNC XI (STAPLE) ×1 IMPLANT
STAPLER CANNULA SEAL XI (STAPLE) ×1
STAPLER ECHELON POWER CIR 29 (STAPLE) ×2 IMPLANT
STAPLER SHEATH (SHEATH) ×1
STAPLER SHEATH ENDOWRIST DVNC (SHEATH) ×1 IMPLANT
SURGILUBE 2OZ TUBE FLIPTOP (MISCELLANEOUS) ×2 IMPLANT
SUT MNCRL AB 4-0 PS2 18 (SUTURE) ×2 IMPLANT
SUT PDS AB 1 CT1 27 (SUTURE) ×4 IMPLANT
SUT PDS AB 1 TP1 96 (SUTURE) IMPLANT
SUT PROLENE 0 CT 2 (SUTURE) IMPLANT
SUT PROLENE 2 0 KS (SUTURE) IMPLANT
SUT PROLENE 2 0 SH DA (SUTURE) ×2 IMPLANT
SUT SILK 2 0 (SUTURE)
SUT SILK 2 0 SH CR/8 (SUTURE) IMPLANT
SUT SILK 2-0 18XBRD TIE 12 (SUTURE) IMPLANT
SUT SILK 3 0 (SUTURE) ×2
SUT SILK 3 0 SH CR/8 (SUTURE) ×2 IMPLANT
SUT SILK 3-0 18XBRD TIE 12 (SUTURE) ×1 IMPLANT
SUT V-LOC BARB 180 2/0GR6 GS22 (SUTURE)
SUT VIC AB 3-0 SH 18 (SUTURE) IMPLANT
SUT VIC AB 3-0 SH 27 (SUTURE)
SUT VIC AB 3-0 SH 27XBRD (SUTURE) IMPLANT
SUT VICRYL 0 UR6 27IN ABS (SUTURE) ×2 IMPLANT
SUTURE V-LC BRB 180 2/0GR6GS22 (SUTURE) IMPLANT
SYR 10ML ECCENTRIC (SYRINGE) ×2 IMPLANT
SYS LAPSCP GELPORT 120MM (MISCELLANEOUS)
SYSTEM LAPSCP GELPORT 120MM (MISCELLANEOUS) IMPLANT
TAPE UMBILICAL COTTON 1/8X30 (MISCELLANEOUS) ×2 IMPLANT
TOWEL OR NON WOVEN STRL DISP B (DISPOSABLE) ×2 IMPLANT
TRAY FOLEY MTR SLVR 16FR STAT (SET/KITS/TRAYS/PACK) ×2 IMPLANT
TROCAR ADV FIXATION 5X100MM (TROCAR) ×2 IMPLANT
TUBING CONNECTING 10 (TUBING) ×4 IMPLANT
TUBING INSUFFLATION 10FT LAP (TUBING) ×2 IMPLANT

## 2019-01-22 NOTE — Anesthesia Preprocedure Evaluation (Addendum)
Anesthesia Evaluation  Patient identified by MRN, date of birth, ID band Patient awake    Reviewed: Allergy & Precautions, H&P , NPO status , Patient's Chart, lab work & pertinent test results, reviewed documented beta blocker date and time   History of Anesthesia Complications (+) PONV and history of anesthetic complications  Airway Mallampati: II  TM Distance: >3 FB Neck ROM: Full    Dental no notable dental hx. (+) Teeth Intact, Dental Advisory Given   Pulmonary neg pulmonary ROS, former smoker,  Quit smoking 1999   Pulmonary exam normal breath sounds clear to auscultation       Cardiovascular hypertension, On Medications and On Home Beta Blockers  Rhythm:Regular Rate:Normal     Neuro/Psych  Neuromuscular disease negative neurological ROS  negative psych ROS   GI/Hepatic Neg liver ROS, Sigmoid diverticulitis   Endo/Other  Pre-diabetic  Renal/GU negative Renal ROS  negative genitourinary   Musculoskeletal  (+) Arthritis , Psoriatic arthritis   Abdominal (+) + obese,   Peds  Hematology negative hematology ROS (+)   Anesthesia Other Findings History of cervical fusion but able to extend neck without pain  Reproductive/Obstetrics negative OB ROS                            Anesthesia Physical  Anesthesia Plan  ASA: II  Anesthesia Plan: General   Post-op Pain Management:    Induction: Intravenous  PONV Risk Score and Plan: 4 or greater and Ondansetron, Dexamethasone, Midazolam, Scopolamine patch - Pre-op, Metaclopromide and Treatment may vary due to age or medical condition  Airway Management Planned: Oral ETT  Additional Equipment: None  Intra-op Plan:   Post-operative Plan: Extubation in OR  Informed Consent: I have reviewed the patients History and Physical, chart, labs and discussed the procedure including the risks, benefits and alternatives for the proposed anesthesia  with the patient or authorized representative who has indicated his/her understanding and acceptance.     Dental advisory given  Plan Discussed with: CRNA  Anesthesia Plan Comments:         Anesthesia Quick Evaluation

## 2019-01-22 NOTE — Anesthesia Postprocedure Evaluation (Signed)
Anesthesia Post Note  Patient: John Morse  Procedure(s) Performed: XI ROBOT ASSISTED RESECTION OF RECTOSIGMOID COLON (N/A ) RIGID PROCTOSCOPY (N/A )     Patient location during evaluation: PACU Anesthesia Type: General Level of consciousness: awake and alert, oriented and patient cooperative Pain management: pain level controlled Vital Signs Assessment: post-procedure vital signs reviewed and stable Respiratory status: spontaneous breathing, nonlabored ventilation and respiratory function stable Cardiovascular status: blood pressure returned to baseline and stable Postop Assessment: no apparent nausea or vomiting Anesthetic complications: no    Last Vitals:  Vitals:   01/22/19 1730 01/22/19 1745  BP: (!) 145/79 (!) 147/76  Pulse: 63 61  Resp: 15 16  Temp:    SpO2: 100% 99%    Last Pain:  Vitals:   01/22/19 1745  TempSrc:   PainSc: [place]

## 2019-01-22 NOTE — Op Note (Signed)
01/22/2019  4:57 PM  PATIENT:  John Morse  63 y.o. male  Patient Care Team: Mayra Neer, MD as PCP - General (Family Medicine) Michael Boston, MD as Consulting Physician (General Surgery) Ronald Lobo, MD as Consulting Physician (Gastroenterology) Enriqueta Shutter, MD as Referring Physician (Dermatology)  PRE-OPERATIVE DIAGNOSIS:  DIVERTICULITIS  POST-OPERATIVE DIAGNOSIS:   SIGMOID DIVERTICULITIS BILATERAL INGUINAL HERNIAS  PROCEDURE:  XI ROBOT ASSISTED RESECTION OF RECTOSIGMOID COLON RIGID PROCTOSCOPY  SURGEON:  Adin Hector, MD  ASSISTANT: Leighton Ruff, MD, FACS. An experienced assistant was required given the standard of surgical care given the complexity of the case.  This assistant was needed for exposure, dissection, suctioning, retraction, anastomosis, instrument exchange, etc.  ANESTHESIA:   local and general  EBL:  Total I/O In: 2000 [I.V.:2000] Out: 225 [Urine:175; Blood:50]  Delay start of Pharmacological VTE agent (>24hrs) due to surgical blood loss or risk of bleeding:  no  DRAINS: 19 Fr Blake drain goes to the pelvis  SPECIMENS:   RECTOSIGMOID COLON (open end proximal) DISTAL ANASTOMOTIC RING (final distal margin)  DISPOSITION OF SPECIMEN:  PATHOLOGY  COUNTS:  YES  PLAN OF CARE: Admit to inpatient   PATIENT DISPOSITION:  PACU - hemodynamically stable.  INDICATION:    Patient chronically immunosuppressed for psoriatic arthritis.  Came in with recurrent episodes of diverticulitis and abscess.  Admitted with IV antibiotics.  Because of recurrent episodes and need for chronic immunosuppression, I recommended segmental resection:  The anatomy & physiology of the digestive tract was discussed.  The pathophysiology was discussed.  Natural history risks without surgery was discussed.   I worked to give an overview of the disease and the frequent need to have multispecialty involvement.  I feel the risks of no intervention will lead to  serious problems that outweigh the operative risks; therefore, I recommended a partial colectomy to remove the pathology.  Laparoscopic & open techniques were discussed.   Risks such as bleeding, infection, abscess, leak, reoperation, possible ostomy, hernia, heart attack, death, and other risks were discussed.  I noted a good likelihood this will help address the problem.   Goals of post-operative recovery were discussed as well.  We will work to minimize complications.  Educational materials on the pathology had been given in the office.  Questions were answered.    The patient expressed understanding & wished to proceed with surgery.  OR FINDINGS:   Patient had inflamed and somewhat foreshortened sigmoid colon.  He did have a 3 cm fluid pocket in the rectosigmoid mesentery.  Seem more consistent with some fat necrosis.  No definite purulence but somewhat suspicious.  No obvious metastatic disease on visceral parietal peritoneum or liver.  The anastomosis rests 15 cm from the anal verge by rigid proctoscopy.  It is an end descending colon to end rectal EEA 29 stapled anastomosis  DESCRIPTION:   Informed consent was confirmed.  The patient underwent general anaesthesia without difficulty.  The patient was positioned appropriately.  VTE prevention in place.  The patient was clipped, prepped, & draped in a sterile fashion.  Surgical timeout confirmed our plan.  The patient was positioned in reverse Trendelenburg.  Abdominal entry was gained using Varess technique at the left subcostal ridge on the anterior abdominal wall.  No elevated EtCO2 noted.  Port placed.  Camera inspection revealed no injury.  Extra ports were carefully placed under direct laparoscopic visualization.  I reflected the greater omentum and the upper abdomen the small bowel in the upper abdomen.  The  patient was carefully positioned.  The Intuitive daVinci robot was docked with camera & instruments carefully placed.  Confirmed  that the patient had obvious bilateral inguinal hernias.  These seem to direct.  They were not large and not incarcerated.  I do not feel is appropriate to repair this at the time of the colon resection  The patient had very thickened and inflamed sigmoid colon.  Somewhat foreshortened.  Mesentery thickened and somewhat fixed   I scored the base of peritoneum of the medial side of the mesentery of the elevated left colon from the ligament of Treitz to the peritoneal reflection of the mid rectum.   I elevated the sigmoid mesentery and entered into the retro-mesenteric plane. We were able to identify the left ureter and gonadal vessels. We kept those posterior within the retroperitoneum and elevated the left colon mesentery off that. I did isolate the inferior mesenteric artery (IMA) pedicle but did not ligate it yet.  I continued distally and got into the avascular plane posterior to the mesorectum. This allowed me to help mobilize the rectum as well by freeing the mesorectum off the sacrum.  I mobilized the peritoneal coverings towards the peritoneal reflection on both the right and left sides of the rectum.  I stayed away from the right and left ureters.  I kept the lateral vascular pedicles to the rectum intact.   I skeletonized the lymph nodes off the inferior mesenteric artery pedicle.  I went down to its takeoff from the aorta.  I isolated the inferior mesenteric vein off of the ligament of Treitz just cephalad to that as well.  After confirming the left ureter was out of the way, I went ahead and ligated the inferior mesenteric artery pedicle just near its takeoff from the aorta.  I did ligate the inferior mesenteric vein in a similar fashion.  We ensured hemostasis.  The IMA pedicle began to have a slow pulsatile ooze.  Held pressure on it.  I did suture ligature of the IMA stump with 3-0 Prolene figure-of-eight suture to good result.  Irrigated.  Hemostasis was good.  I focused on the retroperitoneum  of the rectosigmoid colon.  There was dense inflammatory adhesions to the left ureter and gonadal's more distally.  An end up working from the retroperitoneal side.  Then lateral to medial.  Was able to get to the proximal and mid rectal peritoneal reflections that were not inflamed.  With that I can see a more clear area between the rectosigmoid mesentery and the distal ureter.  We therefore transected into the mesentery since we did this was diverticulitis to avoid any ureteral injury.  Left a cuff of that mesentery on the left ureter.  Entered into a fluid cavity.  Seem most consistent with necrotic fat and no major purulence but certainly not normal.  Not a hematoma.  However that we finally got good release and could not get better axial tension on the rectosigmoid colon.  I skeletonized the mesorectum at the junction at the proximal rectum for the distal point of resection.  I mobilized the left colon off the retroperitoneum and got above the left kidney up towards the splenic flexure.  We then mobilized the left colon in a lateral to medial fashion off the line of Toldt up towards the splenic flexure to ensure good mobilization of the remaining left colon to reach into the pelvis.  I skeletonized at the proximal mesorectum and transected at the proximal rectum using a robotic 45  mm "green" stapler.  90% across with first firing.  Had to do a second firing on the left lateral corner.   I chose a region at the descending/sigmoid junction that was soft and easily reached down to the rectal stump.  I transected the mesentery of the at the descending/sigmoid junction colon radially to preserve remaining colon blood supply.  I created an extraction incision through a small Pfannenstiel incision in the suprapubic region.  Placed a wound protector.  I was able to eviscerate the rectosigmoid and descending colon out the wound.   I clamped the colon proximal to this area using a reusable pursestringer device.   Passed a 2-0 Keith needle. I transected at the descending/sigmoid junction with a scalpel. I got healthy bleeding mucosa.  We sent the rectosigmoid colon specimen off to go to pathology.  We sized the colon orifice.  I chose a 29 EEA anvil stapler system.  I reinforced the prolene pursestring with interrupted silk suture.  I placed the anvil to the open end of the proximal remaining colon and closed around it using the pursestring.    We did copious irrigation with crystalloid solution.  Hemostasis was good.  The distal end of the remaining colon easily reached down to the rectal stump, therefore, splenic flexure mobilization was not needed.  We did irrigation and retroperitoneum and other regions of the abdominal pelvic cavity had good hemostasis.  Ligation on the inferior mesenteric artery stump was intact.  Dr Marcello Moores scrubbed down and did gentle anal dilation and advanced the EEA stapler up the rectal stump. The spike was brought out at the provimal end of the rectal stump under direct visualization.  Dr Marcello Moores attached the anvil of the proximal colon the spike of the stapler. Anvil was tightened down and held clamped for 60 seconds. The EEA stapler was fired and held clamped for 30 seconds. The stapler was released & removed. We noted 2 excellent anastomotic rings. Blue stitch is in the proximal ring.  Dr Marcello Moores did rigid proctoscopy noted the anastomosis was at 15 cm from the anal verge consistent with the proximal rectum.  We did a final irrigation of antibiotic solution (900 mg clindamycin/240 mg gentamicin in a liter of crystalloid) & held that for the pelvic air leak test .  The rectum was insufflated the rectum while clamping the colon proximal to that anastomosis.  There was a negative air leak test. There was no tension of mesentery or bowel at the anastomosis.   Tissues looked viable.  Ureters & bowel uninjured.  The anastomosis looked healthy.  I closed the 12 mm stapler port site with a 0 Vicryl  using a laparoscopic suture passer under direct visualization.  Endoluminal gas was evacuated.  Ports & wound protector removed.  We changed gloves & redraped the patient per colon SSI prevention protocol.  We aspirated the antibiotic irrigation.  Hemostasis was good.  Sterile unused instruments were used from this point.  We did place a 70 [place] Blake drain from the right 5 mm assist port site and tunneled it down to the pelvis.  I was able direct posterior to the anastomosis in the presacral plane.  Also have some weight laying over the left pelvic brim and retroperitoneum near where that fluid cavity had been along the left ureter.  Secured to the skin with 2-0 Prolene suture.  I closed the skin at the port sites using Monocryl stitch and sterile dressing.  I closed the extraction wound using a  0 Vicryl vertical peritoneal closure and a #1 PDS transverse anterior rectal fascial closure like a small Pfannenstiel closure. I closed the skin with some interrupted Monocryl stitches. I placed antibiotic-soaked wicks into the closure at the corners x2.  I placed sterile dressings.     Patient is being extubated go to recovery room. I had discussed postop care with the patient in detail the office & in the holding area.  Instructions are written.  I made an attempt to locate family to discuss patient's status and recommendations.  Try to call his wife on her mobile phone.  No answer.  No one is available at this time.  I will try again later   Adin Hector, M.D., F.A.C.S. Gastrointestinal and Minimally Invasive Surgery [facility] [address], [place], [place] [address] [phone] Main / Paging

## 2019-01-22 NOTE — Discharge Instructions (Signed)
SURGERY: POST OP INSTRUCTIONS °(Surgery for small bowel obstruction, colon resection, etc) ° ° °###################################################################### ° °EAT °Gradually transition to a high fiber diet with a fiber supplement over the next few days after discharge ° °WALK °Walk an hour a day.  Control your pain to do that.   ° °CONTROL PAIN °Control pain so that you can walk, sleep, tolerate sneezing/coughing, go up/down stairs. ° °HAVE A BOWEL MOVEMENT DAILY °Keep your bowels regular to avoid problems.  OK to try a laxative to override constipation.  OK to use an antidairrheal to slow down diarrhea.  Call if not better after 2 tries ° °CALL IF YOU HAVE PROBLEMS/CONCERNS °Call if you are still struggling despite following these instructions. °Call if you have concerns not answered by these instructions ° °###################################################################### ° ° °DIET °Follow a light diet the first few days at home.  Start with a bland diet such as soups, liquids, starchy foods, low fat foods, etc.  If you feel full, bloated, or constipated, stay on a ful liquid or pureed/blenderized diet for a few days until you feel better and no longer constipated. °Be sure to drink plenty of fluids every day to avoid getting dehydrated (feeling dizzy, not urinating, etc.). °Gradually add a fiber supplement to your diet over the next week.  Gradually get back to a regular solid diet.  Avoid fast food or heavy meals the first week as you are more likely to get nauseated. °It is expected for your digestive tract to need a few months to get back to normal.  It is common for your bowel movements and stools to be irregular.  You will have occasional bloating and cramping that should eventually fade away.  Until you are eating solid food normally, off all pain medications, and back to regular activities; your bowels will not be normal. °Focus on eating a low-fat, high fiber diet the rest of your life  (See Getting to Good Bowel Health, below). ° °CARE of your INCISION or WOUND °It is good for closed incision and even open wounds to be washed every day.  Shower every day.  Short baths are fine.  Wash the incisions and wounds clean with soap & water.    °If you have a closed incision(s), wash the incision with soap & water every day.  You may leave closed incisions open to air if it is dry.   You may cover the incision with clean gauze & replace it after your daily shower for comfort. °If you have skin tapes (Steristrips) or skin glue (Dermabond) on your incision, leave them in place.  They will fall off on their own like a scab.  You may trim any edges that curl up with clean scissors.  If you have staples, set up an appointment for them to be removed in the office in 10 days after surgery.  °If you have a drain, wash around the skin exit site with soap & water and place a new dressing of gauze or band aid around the skin every day.  Keep the drain site clean & dry.    °If you have an open wound with packing, see wound care instructions.  In general, it is encouraged that you remove your dressing and packing, shower with soap & water, and replace your dressing once a day.  Pack the wound with clean gauze moistened with normal (0.9%) saline to keep the wound moist & uninfected.  Pressure on the dressing for 30 minutes will stop most wound   bleeding.  Eventually your body will heal & pull the open wound closed over the next few months.  °Raw open wounds will occasionally bleed or secrete yellow drainage until it heals closed.  Drain sites will drain a little until the drain is removed.  Even closed incisions can have mild bleeding or drainage the first few days until the skin edges scab over & seal.   °If you have an open wound with a wound vac, see wound vac care instructions. ° ° ° ° °ACTIVITIES as tolerated °Start light daily activities --- self-care, walking, climbing stairs-- beginning the day after surgery.   Gradually increase activities as tolerated.  Control your pain to be active.  Stop when you are tired.  Ideally, walk several times a day, eventually an hour a day.   °Most people are back to most day-to-day activities in a few weeks.  It takes 4-8 weeks to get back to unrestricted, intense activity. °If you can walk 30 minutes without difficulty, it is safe to try more intense activity such as jogging, treadmill, bicycling, low-impact aerobics, swimming, etc. °Save the most intensive and strenuous activity for last (Usually 4-8 weeks after surgery) such as sit-ups, heavy lifting, contact sports, etc.  Refrain from any intense heavy lifting or straining until you are off narcotics for pain control.  You will have off days, but things should improve week-by-week. °DO NOT PUSH THROUGH PAIN.  Let pain be your guide: If it hurts to do something, don't do it.  Pain is your body warning you to avoid that activity for another week until the pain goes down. °You may drive when you are no longer taking narcotic prescription pain medication, you can comfortably wear a seatbelt, and you can safely make sudden turns/stops to protect yourself without hesitating due to pain. °You may have sexual intercourse when it is comfortable. If it hurts to do something, stop. ° °MEDICATIONS °Take your usually prescribed home medications unless otherwise directed.   °Blood thinners:  °Usually you can restart any strong blood thinners after the second postoperative day.  It is OK to take aspirin right away.    ° If you are on strong blood thinners (warfarin/Coumadin, Plavix, Xerelto, Eliquis, Pradaxa, etc), discuss with your surgeon, medicine PCP, and/or cardiologist for instructions on when to restart the blood thinner & if blood monitoring is needed (PT/INR blood check, etc).   ° ° °PAIN CONTROL °Pain after surgery or related to activity is often due to strain/injury to muscle, tendon, nerves and/or incisions.  This pain is usually  short-term and will improve in a few months.  °To help speed the process of healing and to get back to regular activity more quickly, DO THE FOLLOWING THINGS TOGETHER: °1. Increase activity gradually.  DO NOT PUSH THROUGH PAIN °2. Use Ice and/or Heat °3. Try Gentle Massage and/or Stretching °4. Take over the counter pain medication °5. Take Narcotic prescription pain medication for more severe pain ° °Good pain control = faster recovery.  It is better to take more medicine to be more active than to stay in bed all day to avoid medications. °1.  Increase activity gradually °Avoid heavy lifting at first, then increase to lifting as tolerated over the next 6 weeks. °Do not “push through” the pain.  Listen to your body and avoid positions and maneuvers than reproduce the pain.  Wait a few days before trying something more intense °Walking an hour a day is encouraged to help your body recover faster   and more safely.  Start slowly and stop when getting sore.  If you can walk 30 minutes without stopping or pain, you can try more intense activity (running, jogging, aerobics, cycling, swimming, treadmill, sex, sports, weightlifting, etc.) °Remember: If it hurts to do it, then don’t do it! °2. Use Ice and/or Heat °You will have swelling and bruising around the incisions.  This will take several weeks to resolve. °Ice packs or heating pads (6-8 times a day, 30-60 minutes at a time) will help sooth soreness & bruising. °Some people prefer to use ice alone, heat alone, or alternate between ice & heat.  Experiment and see what works best for you.  Consider trying ice for the first few days to help decrease swelling and bruising; then, switch to heat to help relax sore spots and speed recovery. °Shower every day.  Short baths are fine.  It feels good!  Keep the incisions and wounds clean with soap & water.   °3. Try Gentle Massage and/or Stretching °Massage at the area of pain many times a day °Stop if you feel pain - do not  overdo it °4. Take over the counter pain medication °This helps the muscle and nerve tissues become less irritable and calm down faster °Choose ONE of the following over-the-counter anti-inflammatory medications: °Acetaminophen 500mg tabs (Tylenol) 1-2 pills with every meal and just before bedtime (avoid if you have liver problems or if you have acetaminophen in you narcotic prescription) °Naproxen 220mg tabs (ex. Aleve, Naprosyn) 1-2 pills twice a day (avoid if you have kidney, stomach, IBD, or bleeding problems) °Ibuprofen 200mg tabs (ex. Advil, Motrin) 3-4 pills with every meal and just before bedtime (avoid if you have kidney, stomach, IBD, or bleeding problems) °Take with food/snack several times a day as directed for at least 2 weeks to help keep pain / soreness down & more manageable. °5. Take Narcotic prescription pain medication for more severe pain °A prescription for strong pain control is often given to you upon discharge (for example: oxycodone/Percocet, hydrocodone/Norco/Vicodin, or tramadol/Ultram) °Take your pain medication as prescribed. °Be mindful that most narcotic prescriptions contain Tylenol (acetaminophen) as well - avoid taking too much Tylenol. °If you are having problems/concerns with the prescription medicine (does not control pain, nausea, vomiting, rash, itching, etc.), please call us [phone] to see if we need to switch you to a different pain medicine that will work better for you and/or control your side effects better. °If you need a refill on your pain medication, you must call the office before 4 pm and on weekdays only.  By federal law, prescriptions for narcotics cannot be called into a pharmacy.  They must be filled out on paper & picked up from our office by the patient or authorized caretaker.  Prescriptions cannot be filled after 4 pm nor on weekends.   ° °WHEN TO CALL US [phone] °Severe uncontrolled or worsening pain  °Fever over 101 F (38.5 C) °Concerns with  the incision: Worsening pain, redness, rash/hives, swelling, bleeding, or drainage °Reactions / problems with new medications (itching, rash, hives, nausea, etc.) °Nausea and/or vomiting °Difficulty urinating °Difficulty breathing °Worsening fatigue, dizziness, lightheadedness, blurred vision °Other concerns °If you are not getting better after two weeks or are noticing you are getting worse, contact our office [phone] for further advice.  We may need to adjust your medications, re-evaluate you in the office, send you to the emergency room, or see what other things we can do to help. °The   clinic staff is available to answer your questions during regular business hours (8:30am-5pm).  Please don’t hesitate to call and ask to speak to one of our nurses for clinical concerns.    °A surgeon from [facility] is always on call at the hospitals 24 hours/day °If you have a medical emergency, go to the nearest emergency room or call 911. ° °FOLLOW UP in our office °One the day of your discharge from the hospital (or the next business weekday), please call [facility] to set up or confirm an appointment to see your surgeon in the office for a follow-up appointment.  Usually it is 2-3 weeks after your surgery.   °If you have skin staples at your incision(s), let the office know so we can set up a time in the office for the nurse to remove them (usually around 10 days after surgery). °Make sure that you call for appointments the day of discharge (or the next business weekday) from the hospital to ensure a convenient appointment time. °IF YOU HAVE DISABILITY OR FAMILY LEAVE FORMS, BRING THEM TO THE OFFICE FOR PROCESSING.  DO NOT GIVE THEM TO YOUR DOCTOR. ° °[facility] °[address], [address], [place],   [address] ? °[phone] - Main °[phone] - Toll Free,  [phone] - Fax °[email or web address] ° °GETTING TO GOOD BOWEL HEALTH. °It is  expected for your digestive tract to need a few months to get back to normal.  It is common for your bowel movements and stools to be irregular.  You will have occasional bloating and cramping that should eventually fade away.  Until you are eating solid food normally, off all pain medications, and back to regular activities; your bowels will not be normal.   °Avoiding constipation °The goal: ONE SOFT BOWEL MOVEMENT A DAY!    °Drink plenty of fluids.  Choose water first. °TAKE A FIBER SUPPLEMENT EVERY DAY THE REST OF YOUR LIFE °During your first week back home, gradually add back a fiber supplement every day °Experiment which form you can tolerate.   There are many forms such as powders, tablets, wafers, gummies, etc °Psyllium bran (Metamucil), methylcellulose (Citrucel), Miralax or Glycolax, Benefiber, Flax Seed.  °Adjust the dose week-by-week (1/2 dose/day to 6 doses a day) until you are moving your bowels 1-2 times a day.  Cut back the dose or try a different fiber product if it is giving you problems such as diarrhea or bloating. °Sometimes a laxative is needed to help jump-start bowels if constipated until the fiber supplement can help regulate your bowels.  If you are tolerating eating & you are farting, it is okay to try a gentle laxative such as double dose MiraLax, prune juice, or Milk of Magnesia.  Avoid using laxatives too often. °Stool softeners can sometimes help counteract the constipating effects of narcotic pain medicines.  It can also cause diarrhea, so avoid using for too long. °If you are still constipated despite taking fiber daily, eating solids, and a few doses of laxatives, call our office. °Controlling diarrhea °Try drinking liquids and eating bland foods for a few days to avoid stressing your intestines further. °Avoid dairy products (especially milk & ice cream) for a short time.  The intestines often can lose the ability to digest lactose when stressed. °Avoid foods that cause gassiness or  bloating.  Typical foods include beans and other legumes, cabbage, broccoli, and dairy foods.  Avoid greasy, spicy, fast foods.  Every person has   some sensitivity to other foods, so listen to your body and avoid those foods that trigger problems for you. °Probiotics (such as active yogurt, Align, etc) may help repopulate the intestines and colon with normal bacteria and calm down a sensitive digestive tract °Adding a fiber supplement gradually can help thicken stools by absorbing excess fluid and retrain the intestines to act more normally.  Slowly increase the dose over a few weeks.  Too much fiber too soon can backfire and cause cramping & bloating. °It is okay to try and slow down diarrhea with a few doses of antidiarrheal medicines.   °Bismuth subsalicylate (ex. Kayopectate, Pepto Bismol) for a few doses can help control diarrhea.  Avoid if pregnant.   °Loperamide (Imodium) can slow down diarrhea.  Start with one tablet (2mg) first.  Avoid if you are having fevers or severe pain.  °ILEOSTOMY PATIENTS WILL HAVE CHRONIC DIARRHEA since their colon is not in use.    °Drink plenty of liquids.  You will need to drink even more glasses of water/liquid a day to avoid getting dehydrated. °Record output from your ileostomy.  Expect to empty the bag every 3-4 hours at first.  Most people with a permanent ileostomy empty their bag 4-6 times at the least.   °Use antidiarrheal medicine (especially Imodium) several times a day to avoid getting dehydrated.  Start with a dose at bedtime & breakfast.  Adjust up or down as needed.  Increase antidiarrheal medications as directed to avoid emptying the bag more than 8 times a day (every 3 hours). °Work with your wound ostomy nurse to learn care for your ostomy.  See ostomy care instructions. °TROUBLESHOOTING IRREGULAR BOWELS °1) Start with a soft & bland diet. No spicy, greasy, or fried foods.  °2) Avoid gluten/wheat or dairy products from diet to see if symptoms improve. °3) Miralax  17gm or flax seed mixed in 8oz. water or juice-daily. May use 2-4 times a day as needed. °4) Gas-X, Phazyme, etc. as needed for gas & bloating.  °5) Prilosec (omeprazole) over-the-counter as needed °6)  Consider probiotics (Align, Activa, etc) to help calm the bowels down ° °Call your doctor if you are getting worse or not getting better.  Sometimes further testing (cultures, endoscopy, X-ray studies, CT scans, bloodwork, etc.) may be needed to help diagnose and treat the cause of the diarrhea. °[facility] °[address], [address], [place], [place]  [address] °[phone] - Main.    °[phone]  - Toll Free.   [phone] - Fax °[email or web address] ° ° ° °Diverticulitis ° °Diverticulitis is infection or inflammation of small pouches (diverticula) in the colon that form due to a condition called diverticulosis. Diverticula can trap stool (feces) and bacteria, causing infection and inflammation. °Diverticulitis may cause severe stomach pain and diarrhea. It may lead to tissue damage in the colon that causes bleeding. The diverticula may also burst (rupture) and cause infected stool to enter other areas of the abdomen. °Complications of diverticulitis can include: °· Bleeding. °· Severe infection. °· Severe pain. °· Rupture (perforation) of the colon. °· Blockage (obstruction) of the colon. °What are the causes? °This condition is caused by stool becoming trapped in the diverticula, which allows bacteria to grow in the diverticula. This leads to inflammation and infection. °What increases the risk? °You are more likely to develop this condition if: °· You have diverticulosis. The risk for diverticulosis increases if: °? You are overweight or obese. °? You use tobacco products. °? You   do not get enough exercise. °· You eat a diet that does not include enough fiber. High-fiber foods include fruits, vegetables, beans, nuts, and whole grains. °What are the signs or  symptoms? °Symptoms of this condition may include: °· Pain and tenderness in the abdomen. The pain is normally located on the left side of the abdomen, but it may occur in other areas. °· Fever and chills. °· Bloating. °· Cramping. °· Nausea. °· Vomiting. °· Changes in bowel routines. °· Blood in your stool. °How is this diagnosed? °This condition is diagnosed based on: °· Your medical history. °· A physical exam. °· Tests to make sure there is nothing else causing your condition. These tests may include: °? Blood tests. °? Urine tests. °? Imaging tests of the abdomen, including X-rays, ultrasounds, MRIs, or CT scans. °How is this treated? °Most cases of this condition are mild and can be treated at home. Treatment may include: °· Taking over-the-counter pain medicines. °· Following a clear liquid diet. °· Taking antibiotic medicines by mouth. °· Rest. °More severe cases may need to be treated at a hospital. Treatment may include: °· Not eating or drinking. °· Taking prescription pain medicine. °· Receiving antibiotic medicines through an IV tube. °· Receiving fluids and nutrition through an IV tube. °· Surgery. °When your condition is under control, your health care provider may recommend that you have a colonoscopy. This is an exam to look at the entire large intestine. During the exam, a lubricated, bendable tube is inserted into the anus and then passed into the rectum, colon, and other parts of the large intestine. A colonoscopy can show how severe your diverticula are and whether something else may be causing your symptoms. °Follow these instructions at home: °Medicines °· Take over-the-counter and prescription medicines only as told by your health care provider. These include fiber supplements, probiotics, and stool softeners. °· If you were prescribed an antibiotic medicine, take it as told by your health care provider. Do not stop taking the antibiotic even if you start to feel better. °· Do not drive or  use heavy machinery while taking prescription pain medicine. °General instructions ° °· Follow a full liquid diet or another diet as directed by your health care provider. After your symptoms improve, your health care provider may tell you to change your diet. He or she may recommend that you eat a diet that contains at least 25 g (25 grams) of fiber daily. Fiber makes it easier to pass stool. Healthy sources of fiber include: °? Berries. One cup contains 4-8 grams of fiber. °? Beans or lentils. One half cup contains 5-8 grams of fiber. °? Green vegetables. One cup contains 4 grams of fiber. °· Exercise for at least 30 minutes, 3 times each week. You should exercise hard enough to raise your heart rate and break a sweat. °· Keep all follow-up visits as told by your health care provider. This is important. You may need a colonoscopy. °Contact a health care provider if: °· Your pain does not improve. °· You have a hard time drinking or eating food. °· Your bowel movements do not return to normal. °Get help right away if: °· Your pain gets worse. °· Your symptoms do not get better with treatment. °· Your symptoms suddenly get worse. °· You have a fever. °· You vomit more than one time. °· You have stools that are bloody, black, or tarry. °Summary °· Diverticulitis is infection or inflammation of small pouches (diverticula) in the   colon that form due to a condition called diverticulosis. Diverticula can trap stool (feces) and bacteria, causing infection and inflammation.  You are at higher risk for this condition if you have diverticulosis and you eat a diet that does not include enough fiber.  Most cases of this condition are mild and can be treated at home. More severe cases may need to be treated at a hospital.  When your condition is under control, your health care provider may recommend that you have an exam called a colonoscopy. This exam can show how severe your diverticula are and whether something else  may be causing your symptoms. This information is not intended to replace advice given to you by your health care provider. Make sure you discuss any questions you have with your health care provider. Document Released: 02/01/2005 Document Revised: 04/06/2017 Document Reviewed: 05/27/2016 Elsevier Patient Education  2020 [employer].

## 2019-01-22 NOTE — Transfer of Care (Signed)
Immediate Anesthesia Transfer of Care Note  Patient: John Morse  Procedure(s) Performed: XI ROBOT ASSISTED RESECTION OF RECTOSIGMOID COLON (N/A ) RIGID PROCTOSCOPY (N/A )  Patient Location: PACU  Anesthesia Type:General  Level of Consciousness: sedated, patient cooperative and responds to stimulation  Airway & Oxygen Therapy: Patient Spontanous Breathing and Patient connected to face mask oxygen  Post-op Assessment: Report given to RN and Post -op Vital signs reviewed and stable  Post vital signs: Reviewed and stable  Last Vitals:  Vitals Value Taken Time  BP 149/115 01/22/19 1719  Temp    Pulse 62 01/22/19 1721  Resp 15 01/22/19 1721  SpO2 100 % 01/22/19 1721  Vitals shown include unvalidated device data.  Last Pain:  Vitals:   01/22/19 1132  TempSrc:   PainSc: 0-No pain      Patients Stated Pain Goal: 4 ([id number])  Complications: No apparent anesthesia complications

## 2019-01-22 NOTE — Anesthesia Procedure Notes (Signed)
Procedure Name: Intubation Performed by: Geneieve Duell J, CRNA Pre-anesthesia Checklist: Patient identified, Emergency Drugs available, Suction available, Patient being monitored and Timeout performed Patient Re-evaluated:Patient Re-evaluated prior to induction Oxygen Delivery Method: Circle system utilized Preoxygenation: Pre-oxygenation with 100% oxygen Induction Type: IV induction Ventilation: Mask ventilation without difficulty Laryngoscope Size: Mac and 3 Grade View: Grade I Tube type: Oral Tube size: 7.5 mm Number of attempts: 1 Airway Equipment and Method: Stylet Placement Confirmation: ETT inserted through vocal cords under direct vision,  positive ETCO2 and breath sounds checked- equal and bilateral Secured at: 23 cm Tube secured with: Tape Dental Injury: Teeth and Oropharynx as per pre-operative assessment        

## 2019-01-22 NOTE — H&P (Signed)
John Morse  DOB: [birth year] Married / Language: English / Race: White Male   Patient Care Team: Mayra Neer, MD as PCP - General (Family Medicine) Michael Boston, MD as Consulting Physician (General Surgery) Ronald Lobo, MD as Consulting Physician (Gastroenterology) Enriqueta Shutter, MD as Referring Physician (Dermatology)  ` Patient sent for surgical consultation at the request of Dr Hassell Done  Chief Complaint: Diverticulitis with recent hospitalization ` ` The patient is a gentleman who was admitted with diverticulitis and microperforation. Follow-up CAT scan showed a small abscess but decreased information. His transition to oral antibiotics. His hospital 5 days, discharged seventh of July. Follow up with one of his colorectal surgeons was recommended. Patient had some recurrent symptoms off his oral antibiotics. That was renewed until we could see him. He notes he's feeling better today. No more fevers. Finishing his second 5 day course today. He has some borderline prediabetes managed without medication. Psoriatic arthritis on biologic immunosuppressant, Secukinumab (Cosentyx). Apparently had a colonoscopy last year by Dr. Cristina Gong with [facility] gastroenterology. Claims it was normal.  No personal nor family history of GI/colon cancer, inflammatory bowel disease, irritable bowel syndrome, allergy such as Celiac Sprue, dietary/dairy problems, colitis, ulcers nor gastritis. No recent sick contacts/gastroenteritis. No travel outside the country. No changes in diet. No dysphagia to solids or liquids. No significant heartburn or reflux. No hematochezia, hematemesis, coffee ground emesis. No evidence of prior gastric/peptic ulceration. Patient usually moves his bowels about 4 times a day. 3 in the morning. Using was well formed and they're somewhat loose. He had a vasectomy but no other abdominal other surgeries. He does not smoke. He bicycles up to 30  miles at a time. Had a workup for rectal bleeding last year was negative EGD and colonoscopy and capsule endoscopy according to his gastroenterologist. John Morse comes today with his wife. He has a lot of questions about diverticulosis/diverticulitis in diet.   Patient ready for surgery.  .  (Review of systems as stated in this history (HPI) or in the review of systems. Otherwise all other 12 point ROS are negative) ` ` `  Diagnosis 1. Colon, segmental resection, rectosigmoid - SEGMENT OF COLORECTUM (19 CM) WITH MILD CONGESTION AND REACTIVE CHANGES, COMPATIBLE WITH CLINICALLY STATED PROLAPSE - NEGATIVE FOR DYSPLASIA OR MALIGNANCY - MARGINS APPEAR VIABLE 2. Colon, resection margin (donut), distal margin - COLONIC DONUT WITHIN NORMAL LIMITS Jaquita Folds MD Pathologist, Electronic Signature (Case signed 11/07/2018)   Past Surgical History (Tanisha A. Owens Shark, [place]; 12/03/2018 9:43 AM) Foot Surgery  Left. Knee Surgery  Right. Shoulder Surgery  Bilateral. Tonsillectomy  Vasectomy   Diagnostic Studies History (Tanisha A. Owens Shark, [place]; 12/03/2018 9:43 AM) Colonoscopy  1-5 years ago  Allergies (Tanisha A. Owens Shark, [place]; 12/03/2018 9:43 AM) No Known Drug Allergies  [12/03/2018]: Allergies Reconciled   Medication History (Tanisha A. Owens Shark, [place]; 12/03/2018 9:44 AM) Atenolol-Chlorthalidone (50-25MG  Tablet, Oral) Active. Cetirizine HCl (10MG  Tablet, Oral) Active. Pravastatin Sodium (40MG  Tablet, Oral) Active. Meloxicam (15MG  Tablet, Oral) Active. Medications Reconciled  Social History (Tanisha A. Owens Shark, [place]; 12/03/2018 9:43 AM) Alcohol use  Occasional alcohol use. Caffeine use  Carbonated beverages. Illicit drug use  Remotely quit drug use. Tobacco use  Former smoker.  Family History (Tanisha A. Owens Shark, [place]; 12/03/2018 9:43 AM) Alcohol Abuse  Father. Arthritis  Family Members In General. Diabetes Mellitus  Father. Thyroid problems  Mother.  Other  Problems (Tanisha A. Owens Shark, [place]; 12/03/2018 9:43 AM) Arthritis  Diabetes Mellitus  High blood pressure  Hypercholesterolemia  Review of Systems (Tanisha A. Brown RMA; 12/03/2018 9:43 AM) General Present- Weight Loss. Not Present- Appetite Loss, Chills, Fatigue, Fever, Night Sweats and Weight Gain. Skin Present- Dryness. Not Present- Change in Wart/Mole, Hives, Jaundice, New Lesions, Non-Healing Wounds, Rash and Ulcer. HEENT Present- Hearing Loss, Seasonal Allergies and Wears glasses/contact lenses. Not Present- Earache, Hoarseness, Nose Bleed, Oral Ulcers, Ringing in the Ears, Sinus Pain, Sore Throat, Visual Disturbances and Yellow Eyes. Gastrointestinal Present- Abdominal Pain. Not Present- Bloating, Bloody Stool, Change in Bowel Habits, Chronic diarrhea, Constipation, Difficulty Swallowing, Excessive gas, Gets full quickly at meals, Hemorrhoids, Indigestion, Nausea, Rectal Pain and Vomiting. Male Genitourinary Not Present- Blood in Urine, Change in Urinary Stream, Frequency, Impotence, Nocturia, Painful Urination, Urgency and Urine Leakage. Musculoskeletal Present- Joint Pain. Not Present- Back Pain, Joint Stiffness, Muscle Pain, Muscle Weakness and Swelling of Extremities. Neurological Not Present- Decreased Memory, Fainting, Headaches, Numbness, Seizures, Tingling, Tremor, Trouble walking and Weakness. Psychiatric Not Present- Anxiety, Bipolar, Change in Sleep Pattern, Depression, Fearful and Frequent crying. Endocrine Not Present- Cold Intolerance, Excessive Hunger, Hair Changes, Heat Intolerance, Hot flashes and New Diabetes. Hematology Not Present- Blood Thinners, Easy Bruising, Excessive bleeding, Gland problems, HIV and Persistent Infections.  Vitals (Tanisha A. Brown RMA; 12/03/2018 9:43 AM) 12/03/2018 9:43 AM Weight: 220.6 lb Height: 72in Body Surface Area: 2.22 m Body Mass Index: 29.92 kg/m  Temp.: 97.58F  Pulse: 85 (Regular)  BP: 126/86(Sitting, Left Arm,  Standard)    BP (!) 156/86   Pulse 90   Temp 98.7 F (37.1 C) (Oral)   Resp 18   Ht 6' (1.829 m)   Wt 101.4 kg   SpO2 98%   BMI 30.33 kg/m     Physical Exam Adin Hector MD; 12/03/2018 10:27 AM) General Mental Status-Alert. General Appearance-Not in acute distress, Not Sickly. Orientation-Oriented X3. Hydration-Well hydrated. Voice-Normal.  Integumentary Global Assessment Upon inspection and palpation of skin surfaces of the - Axillae: non-tender, no inflammation or ulceration, no drainage. and Distribution of scalp and body hair is normal. General Characteristics Temperature - normal warmth is noted.  Head and Neck Head-normocephalic, atraumatic with no lesions or palpable masses. Face Global Assessment - atraumatic, no absence of expression. Neck Global Assessment - no abnormal movements, no bruit auscultated on the right, no bruit auscultated on the left, no decreased range of motion, non-tender. Trachea-midline. Thyroid Gland Characteristics - non-tender.  Eye Eyeball - Left-Extraocular movements intact, No Nystagmus - Left. Eyeball - Right-Extraocular movements intact, No Nystagmus - Right. Cornea - Left-No Hazy - Left. Cornea - Right-No Hazy - Right. Sclera/Conjunctiva - Left-No scleral icterus, No Discharge - Left. Sclera/Conjunctiva - Right-No scleral icterus, No Discharge - Right. Pupil - Left-Direct reaction to light normal. Pupil - Right-Direct reaction to light normal.  ENMT Ears Pinna - Left - no drainage observed, no generalized tenderness observed. Pinna - Right - no drainage observed, no generalized tenderness observed. Nose and Sinuses External Inspection of the Nose - no destructive lesion observed. Inspection of the nares - Left - quiet respiration. Inspection of the nares - Right - quiet respiration. Mouth and Throat Lips - Upper Lip - no fissures observed, no pallor noted. Lower Lip - no fissures  observed, no pallor noted. Nasopharynx - no discharge present. Oral Cavity/Oropharynx - Tongue - no dryness observed. Oral Mucosa - no cyanosis observed. Hypopharynx - no evidence of airway distress observed.  Chest and Lung Exam Inspection Movements - Normal and Symmetrical. Accessory muscles - No use of accessory muscles in breathing. Palpation Palpation  of the chest reveals - Non-tender. Auscultation Breath sounds - Normal and Clear.  Cardiovascular Auscultation Rhythm - Regular. Murmurs & Other Heart Sounds - Auscultation of the heart reveals - No Murmurs and No Systolic Clicks.  Abdomen Inspection Inspection of the abdomen reveals - No Visible peristalsis and No Abnormal pulsations. Umbilicus - No Bleeding, No Urine drainage. Palpation/Percussion Palpation and Percussion of the abdomen reveal - Soft, Non Tender, No Rebound tenderness, No Rigidity (guarding) and No Cutaneous hyperesthesia. Note: Abdomen soft. Nontender. Not distended. No umbilical or incisional hernias. No guarding.   Male Genitourinary Sexual Maturity Tanner 5 - Adult hair pattern and Adult penile size and shape. Note: Right inguinal hernia on Valsalva only. Possible left inguinal hernia as well. Otherwise normal external male genitalia   Peripheral Vascular Upper Extremity Inspection - Left - No Cyanotic nailbeds - Left, Not Ischemic. Inspection - Right - No Cyanotic nailbeds - Right, Not Ischemic.  Neurologic Neurologic evaluation reveals -normal attention span and ability to concentrate, able to name objects and repeat phrases. Appropriate fund of knowledge , normal sensation and normal coordination. Mental Status Affect - not angry, not paranoid. Cranial Nerves-Normal Bilaterally. Gait-Normal.  Neuropsychiatric Mental status exam performed with findings of-able to articulate well with normal speech/language, rate, volume and coherence, thought content normal with ability to  perform basic computations and apply abstract reasoning and no evidence of hallucinations, delusions, obsessions or homicidal/suicidal ideation.  Musculoskeletal Global Assessment Spine, Ribs and Pelvis - no instability, subluxation or laxity. Right Upper Extremity - no instability, subluxation or laxity.  Lymphatic Head & Neck  General Head & Neck Lymphatics: Bilateral - Description - No Localized lymphadenopathy. Axillary  General Axillary Region: Bilateral - Description - No Localized lymphadenopathy. Femoral & Inguinal  Generalized Femoral & Inguinal Lymphatics: Left - Description - No Localized lymphadenopathy. Right - Description - No Localized lymphadenopathy.    Assessment & Plan  DIVERTICULITIS OF LARGE INTESTINE WITH PERFORATION AND ABSCESS WITH BLEEDING (K57.21) Impression: Significant perforation and phlegmon consistent with diverticulitis in a patient that is chronically immunosuppressed for start arthritis. 5 day hospitalization. Recurrent symptoms improved on Augmentin.  Ideally we can cool this down in & plan robotic sigmoid colectomy with immediate anastomosis while his immunosuppression is being held to lower the risk of wound infection or abscess or hernia. I had long discussion with the patient and his wife.  He seemed disappointed and frustrated. He is hoping to avoid surgery. I do not recommend that. Discuss with my colorectal partners, Dr. Dema Severin & Marcello Moores, who agree.  The anatomy & physiology of the digestive tract was discussed. The pathophysiology of the colon was discussed. Natural history risks without surgery was discussed. I feel the risks of no intervention will lead to serious problems that outweigh the operative risks; therefore, I recommended a partial colectomy to remove the pathology. Minimally invasive (Robotic/Laparoscopic) & open techniques were discussed.  Risks such as bleeding, infection, abscess, leak, reoperation, possible  ostomy, hernia, heart attack, death, and other risks were discussed. I noted a good likelihood this will help address the problem. Goals of post-operative recovery were discussed as well. Need for adequate nutrition, daily bowel regimen and healthy physical activity, to optimize recovery was noted as well. We will work to minimize complications. Educational materials were available as well. Questions were answered. The patient expresses understanding & now wishes to proceed with surgery.   Adin Hector, MD, FACS, MASCRS Gastrointestinal and Minimally Invasive Surgery    [address], [phone]  [place], [place] [address] [phone] Main / Paging [phone] Fax
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
## 2019-01-23 ENCOUNTER — Encounter (HOSPITAL_COMMUNITY): Payer: Self-pay | Admitting: Surgery

## 2019-01-23 DIAGNOSIS — E876 Hypokalemia: Secondary | ICD-10-CM

## 2019-01-23 LAB — CBC
HCT: 41.5 % (ref 39.0–52.0)
Hemoglobin: 14.1 g/dL (ref 13.0–17.0)
MCH: 28.7 pg (ref 26.0–34.0)
MCHC: 34 g/dL (ref 30.0–36.0)
MCV: 84.5 fL (ref 80.0–100.0)
Platelets: 144 10*3/uL — ABNORMAL LOW (ref 150–400)
RBC: 4.91 MIL/uL (ref 4.22–5.81)
RDW: 13.9 % (ref 11.5–15.5)
WBC: 7.5 10*3/uL (ref 4.0–10.5)
nRBC: 0 % (ref 0.0–0.2)

## 2019-01-23 LAB — BASIC METABOLIC PANEL
Anion gap: 13 (ref 5–15)
BUN: 23 mg/dL — ABNORMAL HIGH (ref 6–20)
CO2: 24 mmol/L (ref 22–32)
Calcium: 8.4 mg/dL — ABNORMAL LOW (ref 8.9–10.3)
Chloride: 98 mmol/L (ref 98–111)
Creatinine, Ser: 1.22 mg/dL (ref 0.61–1.24)
GFR calc Af Amer: >60 mL/min (ref >60)
GFR calc non Af Amer: >60 mL/min (ref >60)
Glucose, Bld: 224 mg/dL — ABNORMAL HIGH (ref 70–99)
Potassium: 3.5 mmol/L (ref 3.5–5.1)
Sodium: 135 mmol/L (ref 135–145)

## 2019-01-23 LAB — MAGNESIUM: Magnesium: 1.8 mg/dL (ref 1.7–2.4)

## 2019-01-23 MED ORDER — SODIUM CHLORIDE 0.9 % IV SOLN: 250.0000 mL | INTRAVENOUS | Status: DC | PRN

## 2019-01-23 MED ORDER — OXYCODONE HCL 5 MG PO TABS: 5.0000 mg | ORAL_TABLET | Freq: Four times a day (QID) | ORAL | 0 refills | Status: DC | PRN

## 2019-01-23 MED ORDER — METHOCARBAMOL 500 MG PO TABS: 500.0000 mg | ORAL_TABLET | Freq: Four times a day (QID) | ORAL | 2 refills | Status: DC | PRN

## 2019-01-23 MED ORDER — CHLORTHALIDONE 25 MG PO TABS
25.0000 mg | ORAL_TABLET | Freq: Every day | ORAL | Status: DC
  Administered 2019-01-23 – 2019-01-24 (×2): 25 mg via ORAL
  Filled 2019-01-23 (×2): qty 1

## 2019-01-23 MED ORDER — OXYCODONE HCL 5 MG PO TABS: 5.0000 mg | ORAL_TABLET | ORAL | Status: DC | PRN

## 2019-01-23 MED ORDER — ATENOLOL 50 MG PO TABS
50.0000 mg | ORAL_TABLET | Freq: Every day | ORAL | Status: DC
  Administered 2019-01-23 – 2019-01-24 (×2): 50 mg via ORAL
  Filled 2019-01-23 (×2): qty 1

## 2019-01-23 MED ORDER — POTASSIUM CHLORIDE CRYS ER 20 MEQ PO TBCR
40.0000 meq | EXTENDED_RELEASE_TABLET | Freq: Every day | ORAL | Status: DC
  Administered 2019-01-23 – 2019-01-24 (×2): 40 meq via ORAL
  Filled 2019-01-23 (×2): qty 2

## 2019-01-23 MED ORDER — SODIUM CHLORIDE 0.9% FLUSH: 3.0000 mL | INTRAVENOUS | Status: DC | PRN

## 2019-01-23 MED ORDER — ATENOLOL-CHLORTHALIDONE 50-25 MG PO TABS: 1.0000 | ORAL_TABLET | Freq: Every day | ORAL | Status: DC

## 2019-01-23 MED ORDER — METHOCARBAMOL 500 MG PO TABS: 1000.0000 mg | ORAL_TABLET | Freq: Four times a day (QID) | ORAL | Status: DC | PRN

## 2019-01-23 MED ORDER — GABAPENTIN 400 MG PO CAPS
400.0000 mg | ORAL_CAPSULE | Freq: Three times a day (TID) | ORAL | Status: DC
  Administered 2019-01-23 – 2019-01-24 (×4): 400 mg via ORAL
  Filled 2019-01-23 (×4): qty 1

## 2019-01-23 MED ORDER — OMEGA-3-ACID ETHYL ESTERS 1 G PO CAPS
2000.0000 mg | ORAL_CAPSULE | Freq: Every day | ORAL | Status: DC
  Administered 2019-01-23 – 2019-01-24 (×2): 2000 mg via ORAL
  Filled 2019-01-23 (×3): qty 2

## 2019-01-23 MED ORDER — METHOCARBAMOL 1000 MG/10ML IJ SOLN
1000.0000 mg | Freq: Four times a day (QID) | INTRAVENOUS | Status: DC | PRN
  Filled 2019-01-23: qty 10

## 2019-01-23 MED ORDER — SODIUM CHLORIDE 0.9% FLUSH
3.0000 mL | Freq: Two times a day (BID) | INTRAVENOUS | Status: DC
  Administered 2019-01-23 – 2019-01-24 (×2): 3 mL via INTRAVENOUS

## 2019-01-23 NOTE — Progress Notes (Addendum)
John Morse [id number] 06-Nov-1955  CARE TEAM:  PCP: Mayra Neer, MD  Outpatient Care Team: Patient Care Team: Mayra Neer, MD as PCP - General (Family Medicine) Michael Boston, MD as Consulting Physician (General Surgery) Ronald Lobo, MD as Consulting Physician (Gastroenterology) Enriqueta Shutter, MD as Referring Physician (Dermatology)  Inpatient Treatment Team: Treatment Team: Attending Provider: Michael Boston, MD; [place] Nurse: Jeannie Fend, RN; Registered Nurse: Cummings, [place] W, RN   Problem List:   Principal Problem:   Recurrent diverticulitis s/p robotic sigmoid colectomy 01/22/2019 Active Problems:   Immunosuppression due to drug therapy for psoriatic arthritis   Pre-diabetes   Hypertension   Hyperlipemia   Psoriatic arthritis ([place])   1 Day Post-Op  01/22/2019  POST-OPERATIVE DIAGNOSIS:   SIGMOID DIVERTICULITIS BILATERAL INGUINAL HERNIAS  PROCEDURE:  XI ROBOT ASSISTED RESECTION OF RECTOSIGMOID COLON RIGID PROCTOSCOPY  SURGEON:  Adin Hector, MD  OR FINDINGS:  Patient had inflamed and somewhat foreshortened sigmoid colon.  He did have a 3 cm fluid pocket in the rectosigmoid mesentery.  Seem more consistent with some fat necrosis.  No definite purulence but somewhat suspicious.  No obvious metastatic disease on visceral parietal peritoneum or liver.  The anastomosis rests 15 cm from the anal verge by rigid proctoscopy.  It is an end descending colon to end rectal EEA 29 stapled anastomosis  Assessment  Recovering okay so far  [facility] Stay = 1 days)  Plan:  -Improve pain control.  Increase gabapentin.  Continue Tylenol.  Switch from tramadol to oxycodone.  Robaxin and hydromorphone as needed breakthrough pain -Correct hypokalemia -Hypertension.  Restart atenolol.  Low threshold to cut had or hold if blood pressure remains soft.  Follow-up on pathology. -VTE prophylaxis- SCDs, etc mobilize as tolerated to help recovery    30 minutes spent in review, evaluation, examination, counseling, and coordination of care.  More than 50% of that time was spent in counseling.  I updated the patient's status to the Patient and his daughter.  Recommendations were made.  Questions were answered.  They expressed understanding & appreciation.   01/23/2019    Subjective: (Chief complaint)  Soreness at Pfannenstiel incision.  Sitting up in chair.  Alert and inquisitive  On phone with daughter.  Many questions about surgery and recovery.  Pathophysiology and differential diagnosis.  I went over that in detail  Objective:  Vital signs:  Vitals:   01/22/19 2027 01/22/19 2129 01/23/19 0059 01/23/19 0506  BP: 124/62 (!) 110/56 112/67 111/63  Pulse: (!) 52 67 62 (!) 56  Resp: 16 16 18 18   Temp: (!) 97.5 F (36.4 C) 97.7 F (36.5 C) 97.6 F (36.4 C) 97.7 F (36.5 C)  TempSrc: Axillary Axillary Oral Oral  SpO2: 90% 97% 95% 94%  Weight:      Height:        Last BM Date: 01/21/19  Intake/Output   Yesterday:  09/16 0701 - 09/17 0700 In: 3602.4 [P.O.:840; I.V.:2662.4; IV Piggyback:100] Out: 2295 [id number]; Drains:650; Blood:50] This shift:  No intake/output data recorded.  Bowel function:  Flatus: YES  BM:  No  Drain: Serosanguinous   Physical Exam:  General: Pt awake/alert/oriented x4 in no acute distress Eyes: PERRL, normal EOM.  Sclera clear.  No icterus Neuro: CN II-XII intact w/o focal sensory/motor deficits. Lymph: No head/neck/groin lymphadenopathy Psych:  No delerium/psychosis/paranoia HENT: Normocephalic, Mucus membranes moist.  No thrush Neck: Supple, No tracheal deviation Chest: No chest wall pain w good excursion CV:  Pulses intact.  Regular rhythm  MS: Normal AROM mjr joints.  No obvious deformity  Abdomen: Soft.  Nondistended.  Tenderness at Pfannenstiel incision only.  Robotic port sites nontender.  No evidence of peritonitis.  No incarcerated hernias.  Ext:  No deformity.  No  mjr edema.  No cyanosis Skin: No petechiae / purpura  Results:   Cultures: Recent Results (from the past 720 hour(s))  Novel Coronavirus, NAA (Hosp order, Send-out to Ref Lab; TAT 18-24 hrs     Status: None   Collection Time: 01/18/19  1:12 PM   Specimen: Nasopharyngeal Swab; Respiratory  Result Value Ref Range Status   SARS-CoV-2, NAA NOT DETECTED NOT DETECTED Final    Comment: (NOTE) This nucleic acid amplification test was developed and its performance characteristics determined by [employer]. Nucleic acid amplification tests include PCR and TMA. This test has not been FDA cleared or approved. This test has been authorized by FDA under an Emergency Use Authorization (EUA). This test is only authorized for the duration of time the declaration that circumstances exist justifying the authorization of the emergency use of in vitro diagnostic tests for detection of SARS-CoV-2 virus and/or diagnosis of COVID-19 infection under section 564(b)(1) of the Act, 21 U.S.C. [id number]) (1), unless the authorization is terminated or revoked sooner. When diagnostic testing is negative, the possibility of a false negative result should be considered in the context of a patient's recent exposures and the presence of clinical signs and symptoms consistent with COVID-19. An individual without symptoms of COVID- 19 and who is not shedding SARS-CoV-2 vi rus would expect to have a negative (not detected) result in this assay. Performed At: [facility] [address] [place], [place] [id number] Rush Farmer MD [id number]    [place]  Final    Comment: Performed at [facility], [place] [address] [place], [place] [address]    Labs: Results for orders placed or performed during the hospital encounter of 01/22/19 (from the past 48 hour(s))  Glucose, capillary     Status: Abnormal   Collection Time: 01/22/19 11:19 AM  Result Value Ref Range    Glucose-Capillary 131 (H) 70 - 99 mg/dL  Basic metabolic panel     Status: Abnormal   Collection Time: 01/23/19  3:25 AM  Result Value Ref Range   Sodium 135 135 - 145 mmol/L   Potassium 3.5 3.5 - 5.1 mmol/L   Chloride 98 98 - 111 mmol/L   CO2 24 22 - 32 mmol/L   Glucose, Bld 224 (H) 70 - 99 mg/dL   BUN 23 (H) 6 - 20 mg/dL   Creatinine, Ser 1.22 0.61 - 1.24 mg/dL   Calcium 8.4 (L) 8.9 - 10.3 mg/dL   GFR calc non Af Amer >60 >60 mL/min   GFR calc Af Amer >60 >60 mL/min   Anion gap 13 5 - 15    Comment: Performed at [facility], [place] [address], [place],  [address]  CBC     Status: Abnormal   Collection Time: 01/23/19  3:25 AM  Result Value Ref Range   WBC 7.5 4.0 - 10.5 K/uL   RBC 4.91 4.22 - 5.81 MIL/uL   Hemoglobin 14.1 13.0 - 17.0 g/dL   HCT 41.5 39.0 - 52.0 %   MCV 84.5 80.0 - 100.0 fL   MCH 28.7 26.0 - 34.0 pg   MCHC 34.0 30.0 - 36.0 g/dL   RDW 13.9 11.5 - 15.5 %   Platelets 144 (L) 150 - 400 K/uL  nRBC 0.0 0.0 - 0.2 %    Comment: Performed at [facility], [place] [address] [place], [place] [address]  Magnesium     Status: None   Collection Time: 01/23/19  3:25 AM  Result Value Ref Range   Magnesium 1.8 1.7 - 2.4 mg/dL    Comment: Performed at [facility], [place] [address], [place], [place] [address]    Imaging / Studies: No results found.  Medications / Allergies: per chart  Antibiotics: Anti-infectives (From admission, onward)   Start     Dose/Rate Route Frequency Ordered Stop   01/23/19 0200  cefoTEtan (CEFOTAN) 2 g in sodium chloride 0.9 % 100 mL IVPB     2 g 200 mL/hr over 30 Minutes Intravenous Every 12 hours 01/22/19 1824 01/23/19 0325   01/22/19 1648  clindamycin (CLEOCIN) 900 mg, gentamicin (GARAMYCIN) 240 mg in sodium chloride 0.9 % 1,000 mL for intraperitoneal lavage  Status:  Discontinued       As needed 01/22/19 1649 01/22/19 1717   01/22/19 1400  neomycin (MYCIFRADIN) tablet 1,000 mg   Status:  Discontinued     1,000 mg Oral 3 times per day 01/22/19 1113 01/22/19 1122   01/22/19 1400  metroNIDAZOLE (FLAGYL) tablet 1,000 mg  Status:  Discontinued     1,000 mg Oral 3 times per day 01/22/19 1113 01/22/19 1122   01/22/19 1115  cefoTEtan (CEFOTAN) 2 g in sodium chloride 0.9 % 100 mL IVPB     2 g 200 mL/hr over 30 Minutes Intravenous On call to O.R. 01/22/19 1113 01/22/19 1506   01/22/19 0600  clindamycin (CLEOCIN) 900 mg, gentamicin (GARAMYCIN) 240 mg in sodium chloride 0.9 % 1,000 mL for intraperitoneal lavage  Status:  Discontinued      Irrigation To Surgery 01/21/19 0748 01/22/19 1813        Note: Portions of this report may have been transcribed using voice recognition software. Every effort was made to ensure accuracy; however, inadvertent computerized transcription errors may be present.   Any transcriptional errors that result from this process are unintentional.     Adin Hector, MD, FACS, MASCRS Gastrointestinal and Minimally Invasive Surgery    [address], [place], [place] [address] [phone] Main / Paging [phone] Fax

## 2019-01-24 LAB — CREATININE, SERUM
Creatinine, Ser: 0.99 mg/dL (ref 0.61–1.24)
GFR calc Af Amer: >60 mL/min (ref >60)
GFR calc non Af Amer: >60 mL/min (ref >60)

## 2019-01-24 LAB — HEMOGLOBIN: Hemoglobin: 13.7 g/dL (ref 13.0–17.0)

## 2019-01-24 LAB — SURGICAL PATHOLOGY

## 2019-01-24 LAB — POTASSIUM: Potassium: 3.7 mmol/L (ref 3.5–5.1)

## 2019-01-24 NOTE — Progress Notes (Signed)
Pt DC home in stable condition to care of wife.
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
## 2019-02-25 DIAGNOSIS — M79643 Pain in unspecified hand: Secondary | ICD-10-CM | POA: Diagnosis not present

## 2019-02-25 DIAGNOSIS — Z79899 Other long term (current) drug therapy: Secondary | ICD-10-CM | POA: Diagnosis not present

## 2019-02-25 DIAGNOSIS — M1990 Unspecified osteoarthritis, unspecified site: Secondary | ICD-10-CM | POA: Diagnosis not present

## 2019-02-25 DIAGNOSIS — L4059 Other psoriatic arthropathy: Secondary | ICD-10-CM | POA: Diagnosis not present

## 2019-06-30 DIAGNOSIS — M79643 Pain in unspecified hand: Secondary | ICD-10-CM | POA: Diagnosis not present

## 2019-06-30 DIAGNOSIS — Z79899 Other long term (current) drug therapy: Secondary | ICD-10-CM | POA: Diagnosis not present

## 2019-06-30 DIAGNOSIS — M25551 Pain in right hip: Secondary | ICD-10-CM | POA: Diagnosis not present

## 2019-06-30 DIAGNOSIS — L4059 Other psoriatic arthropathy: Secondary | ICD-10-CM | POA: Diagnosis not present

## 2019-06-30 DIAGNOSIS — M25562 Pain in left knee: Secondary | ICD-10-CM | POA: Diagnosis not present

## 2019-06-30 DIAGNOSIS — M1990 Unspecified osteoarthritis, unspecified site: Secondary | ICD-10-CM | POA: Diagnosis not present

## 2019-06-30 DIAGNOSIS — M25561 Pain in right knee: Secondary | ICD-10-CM | POA: Diagnosis not present

## 2019-07-08 DIAGNOSIS — E782 Mixed hyperlipidemia: Secondary | ICD-10-CM | POA: Diagnosis not present

## 2019-07-08 DIAGNOSIS — E1169 Type 2 diabetes mellitus with other specified complication: Secondary | ICD-10-CM | POA: Diagnosis not present

## 2019-07-08 DIAGNOSIS — I10 Essential (primary) hypertension: Secondary | ICD-10-CM | POA: Diagnosis not present

## 2019-07-08 DIAGNOSIS — E291 Testicular hypofunction: Secondary | ICD-10-CM | POA: Diagnosis not present

## 2019-07-22 DIAGNOSIS — H7203 Central perforation of tympanic membrane, bilateral: Secondary | ICD-10-CM | POA: Diagnosis not present

## 2019-07-22 DIAGNOSIS — H6121 Impacted cerumen, right ear: Secondary | ICD-10-CM | POA: Diagnosis not present

## 2019-08-20 DIAGNOSIS — K641 Second degree hemorrhoids: Secondary | ICD-10-CM | POA: Diagnosis not present

## 2019-08-20 DIAGNOSIS — K648 Other hemorrhoids: Secondary | ICD-10-CM | POA: Diagnosis not present

## 2019-08-20 DIAGNOSIS — K4020 Bilateral inguinal hernia, without obstruction or gangrene, not specified as recurrent: Secondary | ICD-10-CM | POA: Diagnosis not present

## 2019-08-20 DIAGNOSIS — Z86010 Personal history of colonic polyps: Secondary | ICD-10-CM | POA: Diagnosis not present

## 2019-12-30 DIAGNOSIS — M1990 Unspecified osteoarthritis, unspecified site: Secondary | ICD-10-CM | POA: Diagnosis not present

## 2019-12-30 DIAGNOSIS — L4059 Other psoriatic arthropathy: Secondary | ICD-10-CM | POA: Diagnosis not present

## 2019-12-30 DIAGNOSIS — M549 Dorsalgia, unspecified: Secondary | ICD-10-CM | POA: Diagnosis not present

## 2019-12-30 DIAGNOSIS — Z79899 Other long term (current) drug therapy: Secondary | ICD-10-CM | POA: Diagnosis not present

## 2020-01-02 DIAGNOSIS — Z125 Encounter for screening for malignant neoplasm of prostate: Secondary | ICD-10-CM | POA: Diagnosis not present

## 2020-01-02 DIAGNOSIS — Z0000 Encounter for general adult medical examination without abnormal findings: Secondary | ICD-10-CM | POA: Diagnosis not present

## 2020-01-02 DIAGNOSIS — I10 Essential (primary) hypertension: Secondary | ICD-10-CM | POA: Diagnosis not present

## 2020-01-02 DIAGNOSIS — E782 Mixed hyperlipidemia: Secondary | ICD-10-CM | POA: Diagnosis not present

## 2020-01-02 DIAGNOSIS — E1169 Type 2 diabetes mellitus with other specified complication: Secondary | ICD-10-CM | POA: Diagnosis not present

## 2020-01-16 DIAGNOSIS — R634 Abnormal weight loss: Secondary | ICD-10-CM | POA: Diagnosis not present

## 2020-01-16 DIAGNOSIS — K760 Fatty (change of) liver, not elsewhere classified: Secondary | ICD-10-CM | POA: Diagnosis not present

## 2020-01-16 DIAGNOSIS — E1169 Type 2 diabetes mellitus with other specified complication: Secondary | ICD-10-CM | POA: Diagnosis not present

## 2020-01-19 DIAGNOSIS — H7203 Central perforation of tympanic membrane, bilateral: Secondary | ICD-10-CM | POA: Diagnosis not present

## 2020-01-19 DIAGNOSIS — H6123 Impacted cerumen, bilateral: Secondary | ICD-10-CM | POA: Diagnosis not present

## 2020-01-20 DIAGNOSIS — R269 Unspecified abnormalities of gait and mobility: Secondary | ICD-10-CM | POA: Diagnosis not present

## 2020-01-20 DIAGNOSIS — M25552 Pain in left hip: Secondary | ICD-10-CM | POA: Diagnosis not present

## 2020-01-27 DIAGNOSIS — R269 Unspecified abnormalities of gait and mobility: Secondary | ICD-10-CM | POA: Diagnosis not present

## 2020-01-27 DIAGNOSIS — M25552 Pain in left hip: Secondary | ICD-10-CM | POA: Diagnosis not present

## 2020-02-03 DIAGNOSIS — M25552 Pain in left hip: Secondary | ICD-10-CM | POA: Diagnosis not present

## 2020-02-03 DIAGNOSIS — R269 Unspecified abnormalities of gait and mobility: Secondary | ICD-10-CM | POA: Diagnosis not present

## 2020-02-10 DIAGNOSIS — R269 Unspecified abnormalities of gait and mobility: Secondary | ICD-10-CM | POA: Diagnosis not present

## 2020-02-10 DIAGNOSIS — M25552 Pain in left hip: Secondary | ICD-10-CM | POA: Diagnosis not present

## 2020-02-16 DIAGNOSIS — E1169 Type 2 diabetes mellitus with other specified complication: Secondary | ICD-10-CM | POA: Diagnosis not present

## 2020-02-16 DIAGNOSIS — E782 Mixed hyperlipidemia: Secondary | ICD-10-CM | POA: Diagnosis not present

## 2020-02-16 DIAGNOSIS — I10 Essential (primary) hypertension: Secondary | ICD-10-CM | POA: Diagnosis not present

## 2020-02-17 DIAGNOSIS — R269 Unspecified abnormalities of gait and mobility: Secondary | ICD-10-CM | POA: Diagnosis not present

## 2020-02-17 DIAGNOSIS — M25552 Pain in left hip: Secondary | ICD-10-CM | POA: Diagnosis not present

## 2020-02-24 DIAGNOSIS — R269 Unspecified abnormalities of gait and mobility: Secondary | ICD-10-CM | POA: Diagnosis not present

## 2020-02-24 DIAGNOSIS — M25552 Pain in left hip: Secondary | ICD-10-CM | POA: Diagnosis not present

## 2020-03-03 DIAGNOSIS — R269 Unspecified abnormalities of gait and mobility: Secondary | ICD-10-CM | POA: Diagnosis not present

## 2020-03-03 DIAGNOSIS — M25552 Pain in left hip: Secondary | ICD-10-CM | POA: Diagnosis not present

## 2020-03-16 DIAGNOSIS — R269 Unspecified abnormalities of gait and mobility: Secondary | ICD-10-CM | POA: Diagnosis not present

## 2020-03-16 DIAGNOSIS — M25552 Pain in left hip: Secondary | ICD-10-CM | POA: Diagnosis not present

## 2020-04-13 DIAGNOSIS — E1169 Type 2 diabetes mellitus with other specified complication: Secondary | ICD-10-CM | POA: Diagnosis not present

## 2020-05-25 DIAGNOSIS — R059 Cough, unspecified: Secondary | ICD-10-CM | POA: Diagnosis not present

## 2020-05-25 DIAGNOSIS — B349 Viral infection, unspecified: Secondary | ICD-10-CM | POA: Diagnosis not present

## 2020-06-17 DIAGNOSIS — H40011 Open angle with borderline findings, low risk, right eye: Secondary | ICD-10-CM | POA: Diagnosis not present

## 2020-07-01 DIAGNOSIS — M549 Dorsalgia, unspecified: Secondary | ICD-10-CM | POA: Diagnosis not present

## 2020-07-01 DIAGNOSIS — Z79899 Other long term (current) drug therapy: Secondary | ICD-10-CM | POA: Diagnosis not present

## 2020-07-01 DIAGNOSIS — L4059 Other psoriatic arthropathy: Secondary | ICD-10-CM | POA: Diagnosis not present

## 2020-07-01 DIAGNOSIS — M1990 Unspecified osteoarthritis, unspecified site: Secondary | ICD-10-CM | POA: Diagnosis not present

## 2020-07-16 DIAGNOSIS — E669 Obesity, unspecified: Secondary | ICD-10-CM | POA: Diagnosis not present

## 2020-07-16 DIAGNOSIS — I10 Essential (primary) hypertension: Secondary | ICD-10-CM | POA: Diagnosis not present

## 2020-07-16 DIAGNOSIS — E782 Mixed hyperlipidemia: Secondary | ICD-10-CM | POA: Diagnosis not present

## 2020-07-16 DIAGNOSIS — E1169 Type 2 diabetes mellitus with other specified complication: Secondary | ICD-10-CM | POA: Diagnosis not present

## 2020-07-20 DIAGNOSIS — H7203 Central perforation of tympanic membrane, bilateral: Secondary | ICD-10-CM | POA: Diagnosis not present

## 2020-07-20 DIAGNOSIS — H906 Mixed conductive and sensorineural hearing loss, bilateral: Secondary | ICD-10-CM | POA: Diagnosis not present

## 2020-07-20 DIAGNOSIS — H6983 Other specified disorders of Eustachian tube, bilateral: Secondary | ICD-10-CM | POA: Diagnosis not present

## 2020-12-29 DIAGNOSIS — Z79899 Other long term (current) drug therapy: Secondary | ICD-10-CM | POA: Diagnosis not present

## 2020-12-29 DIAGNOSIS — L4059 Other psoriatic arthropathy: Secondary | ICD-10-CM | POA: Diagnosis not present

## 2020-12-29 DIAGNOSIS — M7072 Other bursitis of hip, left hip: Secondary | ICD-10-CM | POA: Diagnosis not present

## 2020-12-29 DIAGNOSIS — M549 Dorsalgia, unspecified: Secondary | ICD-10-CM | POA: Diagnosis not present

## 2020-12-29 DIAGNOSIS — M1990 Unspecified osteoarthritis, unspecified site: Secondary | ICD-10-CM | POA: Diagnosis not present

## 2021-01-07 DIAGNOSIS — E291 Testicular hypofunction: Secondary | ICD-10-CM | POA: Diagnosis not present

## 2021-01-07 DIAGNOSIS — Z125 Encounter for screening for malignant neoplasm of prostate: Secondary | ICD-10-CM | POA: Diagnosis not present

## 2021-01-07 DIAGNOSIS — E1169 Type 2 diabetes mellitus with other specified complication: Secondary | ICD-10-CM | POA: Diagnosis not present

## 2021-01-07 DIAGNOSIS — Z23 Encounter for immunization: Secondary | ICD-10-CM | POA: Diagnosis not present

## 2021-01-07 DIAGNOSIS — E782 Mixed hyperlipidemia: Secondary | ICD-10-CM | POA: Diagnosis not present

## 2021-01-07 DIAGNOSIS — I10 Essential (primary) hypertension: Secondary | ICD-10-CM | POA: Diagnosis not present

## 2021-01-07 DIAGNOSIS — Z0000 Encounter for general adult medical examination without abnormal findings: Secondary | ICD-10-CM | POA: Diagnosis not present

## 2021-01-07 DIAGNOSIS — L4050 Arthropathic psoriasis, unspecified: Secondary | ICD-10-CM | POA: Diagnosis not present

## 2021-01-18 DIAGNOSIS — H6983 Other specified disorders of Eustachian tube, bilateral: Secondary | ICD-10-CM | POA: Diagnosis not present

## 2021-01-18 DIAGNOSIS — H7203 Central perforation of tympanic membrane, bilateral: Secondary | ICD-10-CM | POA: Diagnosis not present

## 2021-01-18 DIAGNOSIS — H6122 Impacted cerumen, left ear: Secondary | ICD-10-CM | POA: Diagnosis not present

## 2021-02-25 IMAGING — CT CT ABDOMEN AND PELVIS WITH CONTRAST
2 of 5 series · 15 of 46 positions shown, 17 images · IV contrast (Omni 300)
Comparison: None.

CLINICAL DATA: Lower abdominal pain

EXAM:
CT ABDOMEN AND PELVIS WITH CONTRAST
TECHNIQUE: Multidetector CT imaging of the abdomen and pelvis was performed
using the standard protocol following bolus administration of
intravenous contrast.
CONTRAST:  100mL 49LUHP-Q33 IOPAMIDOL (49LUHP-Q33) INJECTION 61%

[Series 3: a/p w/ 5mm · axial · 0.93mm/px · z∈[+757,+1227]mm · 12 of 108 slices shown, 14 images]
[im 7/108  soft-tissue]
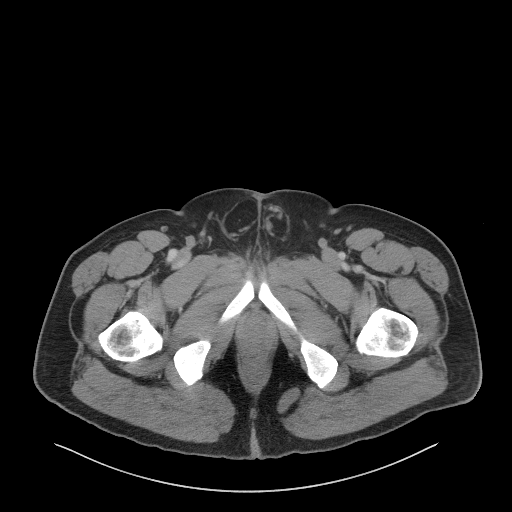
[im 7/108  bone]
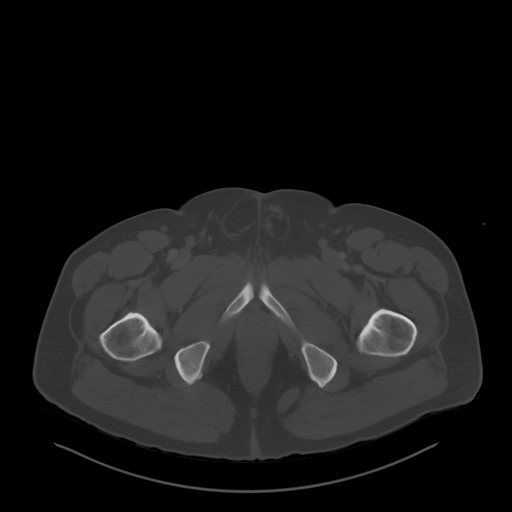
[im 19/108  soft-tissue]
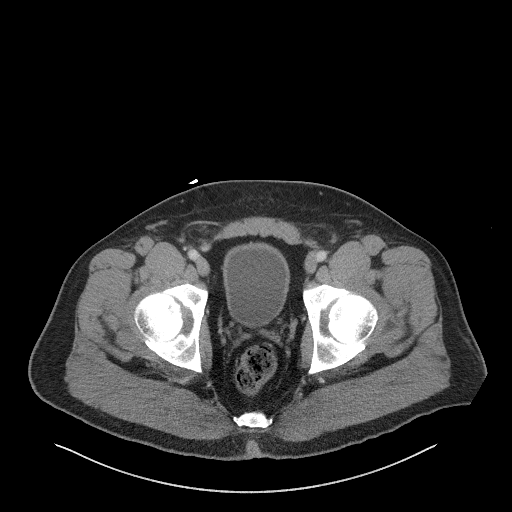
[im 26/108  soft-tissue]
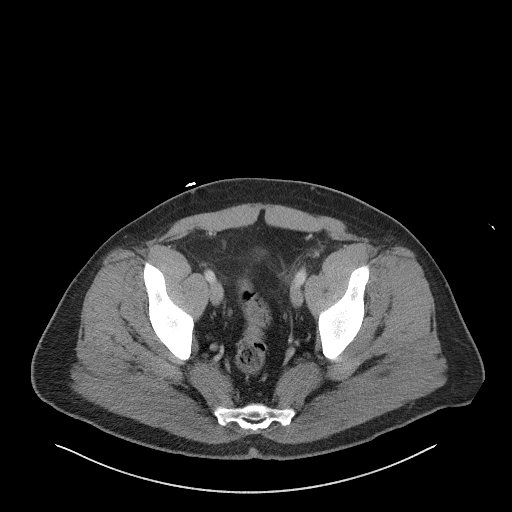
[im 32/108  soft-tissue]
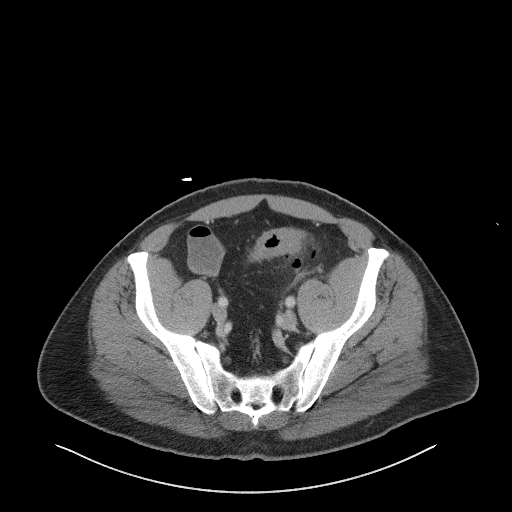
[im 45/108  soft-tissue]
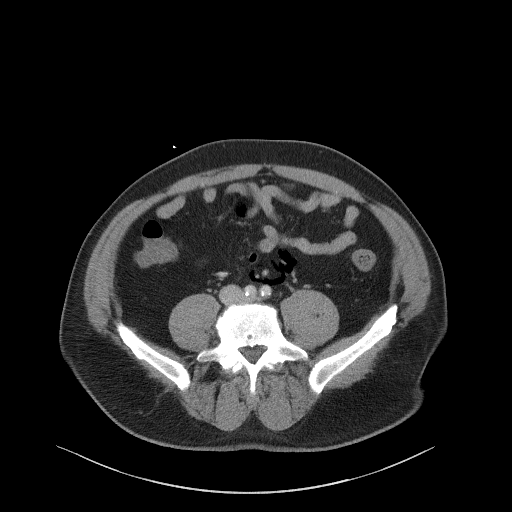
[im 51/108  soft-tissue]
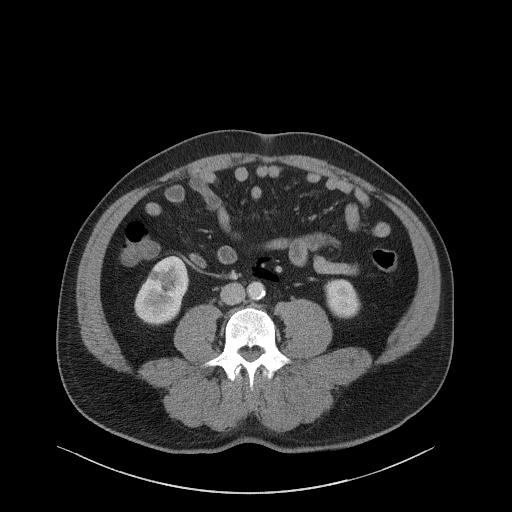
[im 57/108  soft-tissue]
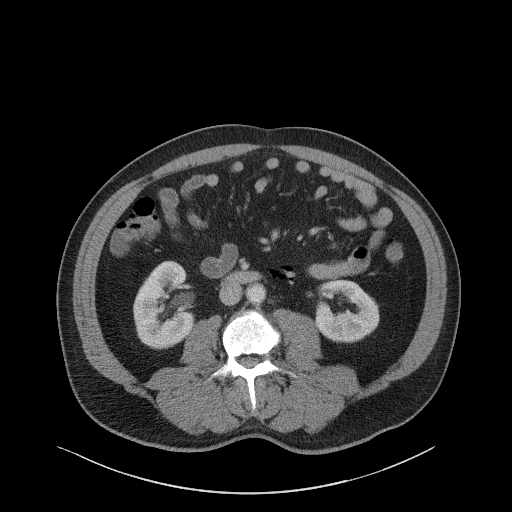
[im 70/108  soft-tissue]
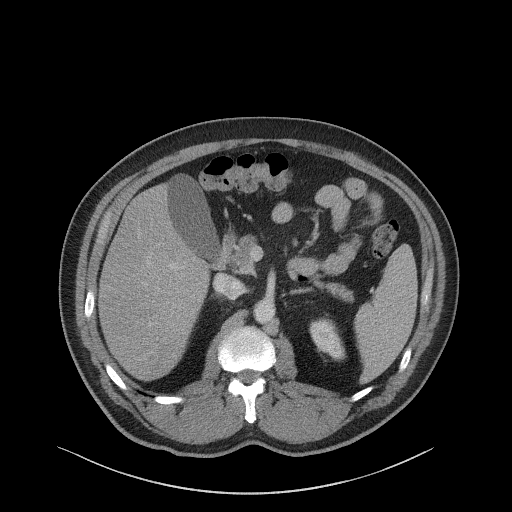
[im 76/108  soft-tissue]
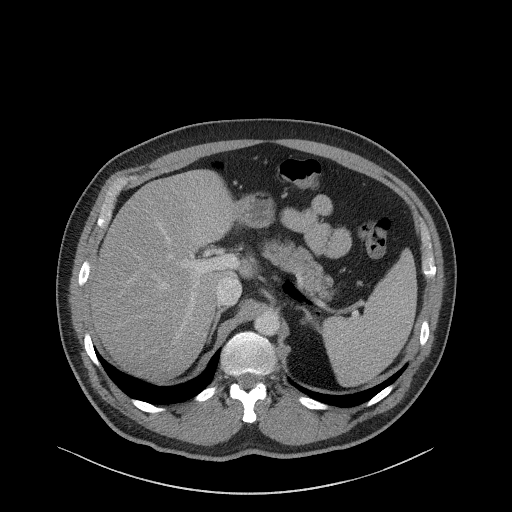
[im 76/108  bone]
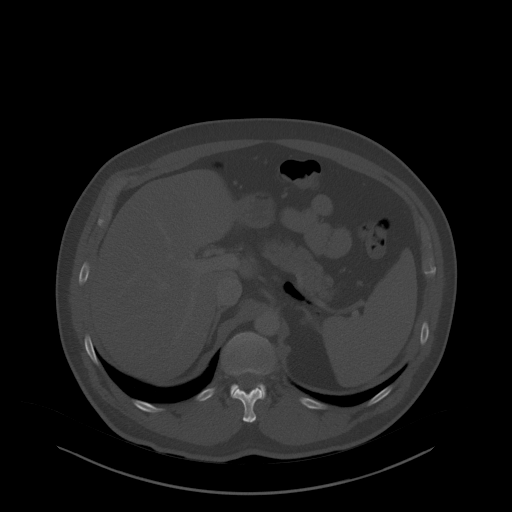
[im 82/108  soft-tissue]
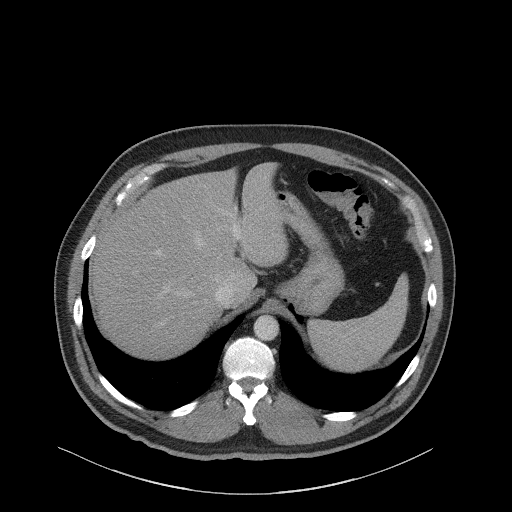
[im 95/108  soft-tissue]
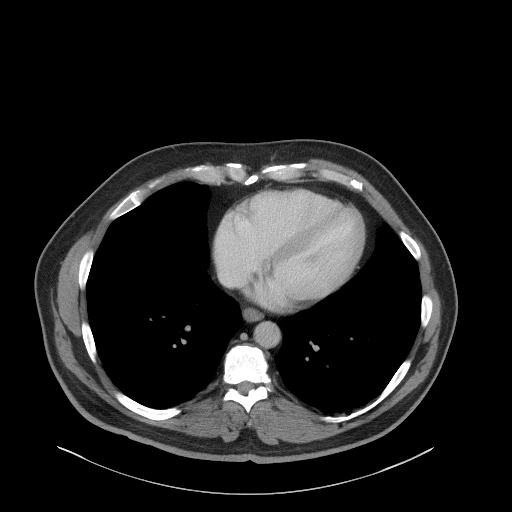
[im 101/108  soft-tissue]
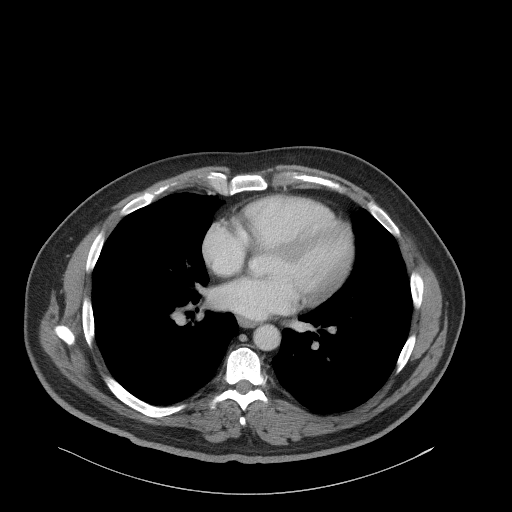

[Series 6: a/p w/ cor · coronal · 1.02mm/px · 3 of 186 slices shown]
[im 62/186  soft-tissue]
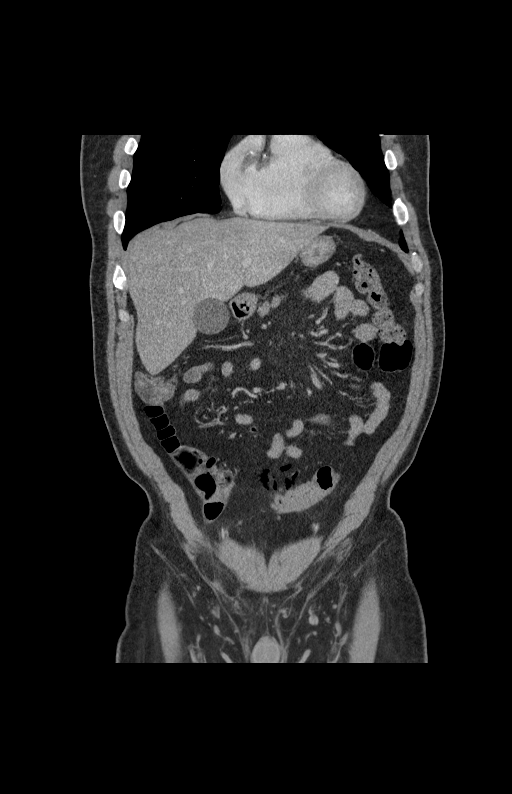
[im 83/186  soft-tissue]
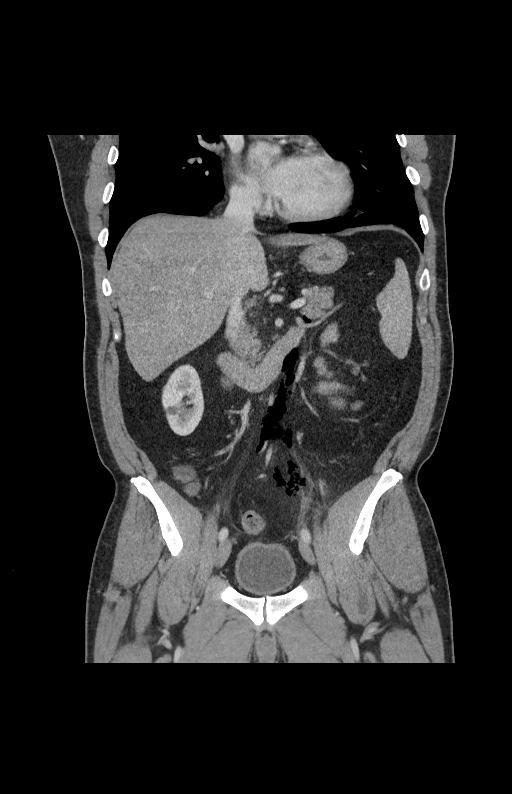
[im 103/186  soft-tissue]
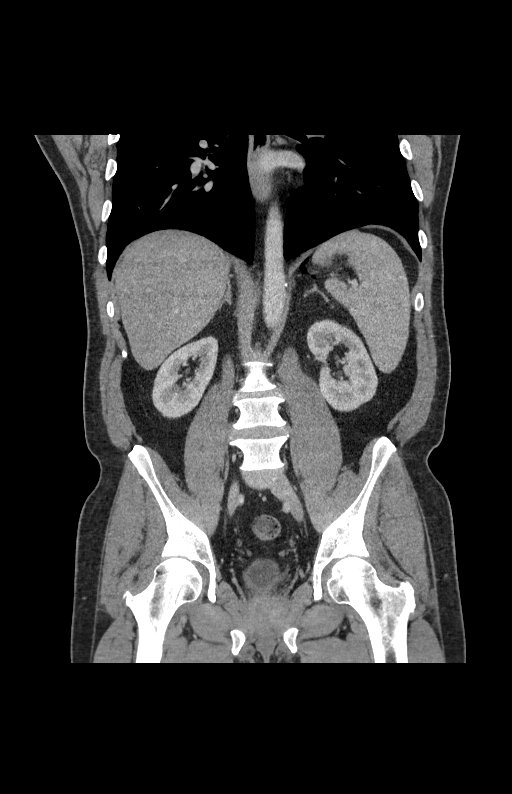

[15 of 46 positions shown; findings below may reference images not displayed]

FINDINGS: Lower chest: No acute abnormality.

Hepatobiliary: Mild fatty infiltration of the liver is noted. The
gallbladder is within normal limits.

Pancreas: Unremarkable. No pancreatic ductal dilatation or
surrounding inflammatory changes.

Spleen: Normal in size without focal abnormality.

Adrenals/Urinary Tract: Adrenal glands are within normal limits.
Kidneys are well visualized bilaterally without renal calculi or
obstructive changes. Right renal cyst is seen. Bladder is partially
decompressed.

Stomach/Bowel: There are changes consistent with diverticulitis in
the sigmoid colon with a moderate amount of extraluminal air which
tracks superiorly along the vascular pedicle. No definitive abscess
is noted at this time. The more proximal colon is within normal
limits. The appendix is unremarkable. The stomach and small bowel
are within normal limits.

Vascular/Lymphatic: Aortic atherosclerosis. No enlarged abdominal or
pelvic lymph nodes.

Reproductive: Prostate is unremarkable.

Other: No abdominal wall hernia or abnormality. No abdominopelvic
ascites.

Musculoskeletal: No acute or significant osseous findings.
IMPRESSION: Changes consistent with diverticulitis in the sigmoid colon with
likely diverticular rupture and air extending along the vascular
pedicle of mesentery.

## 2021-03-01 IMAGING — CT CT ABDOMEN AND PELVIS WITH CONTRAST
2 of 5 series · 16 of 46 positions shown, 18 images · IV contrast (omnipaque)
Comparison: CT scan dated 11/07/2018

CLINICAL DATA: Abdominal pain.  Perforated diverticulitis.

EXAM:
CT ABDOMEN AND PELVIS WITH CONTRAST
TECHNIQUE: Multidetector CT imaging of the abdomen and pelvis was performed
using the standard protocol following bolus administration of
intravenous contrast.
CONTRAST:  100mL OMNIPAQUE IOHEXOL 300 MG/ML  SOLN

[Series 3: abdomen 5.0 · axial · 0.75mm/px · z∈[+845,+1265]mm · 13 of 97 slices shown, 15 images]
[im 7/97  soft-tissue]
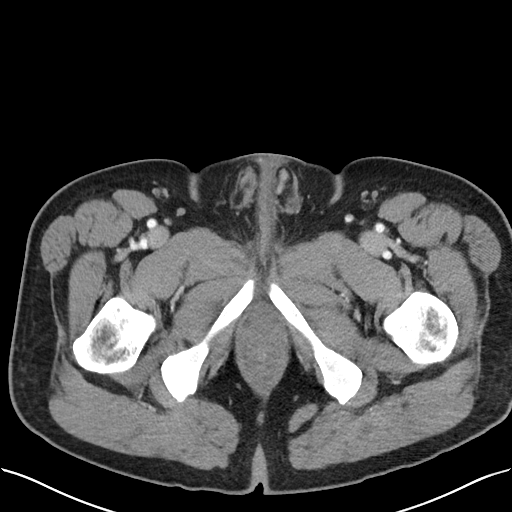
[im 7/97  bone]
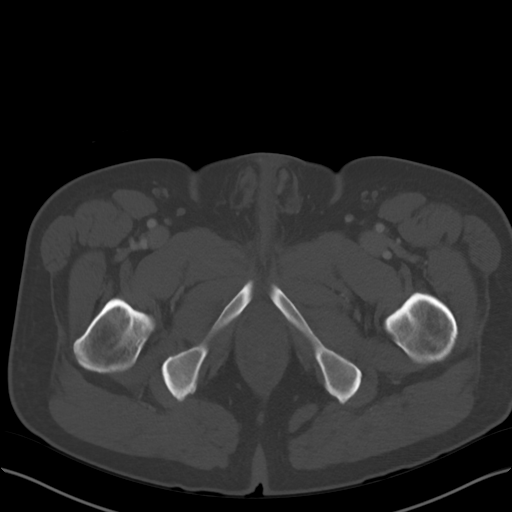
[im 13/97  soft-tissue]
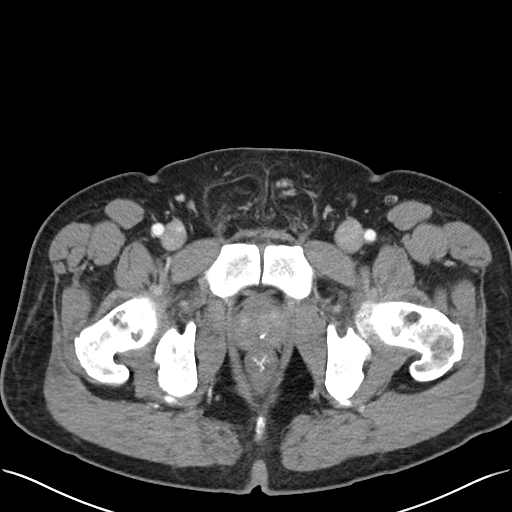
[im 19/97  soft-tissue]
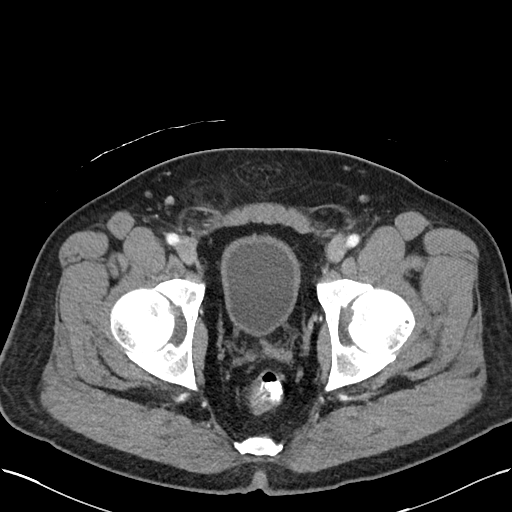
[im 31/97  soft-tissue]
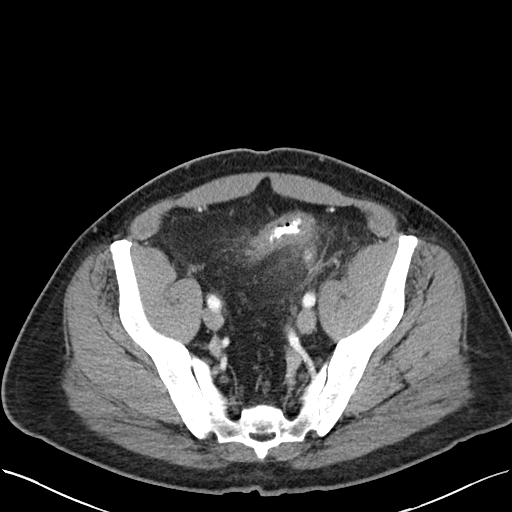
[im 37/97  soft-tissue]
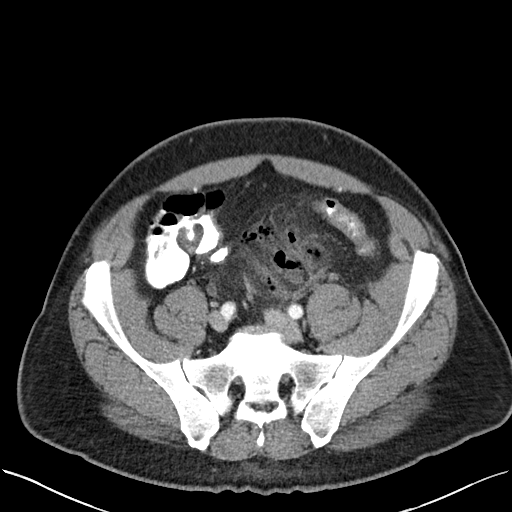
[im 43/97  soft-tissue]
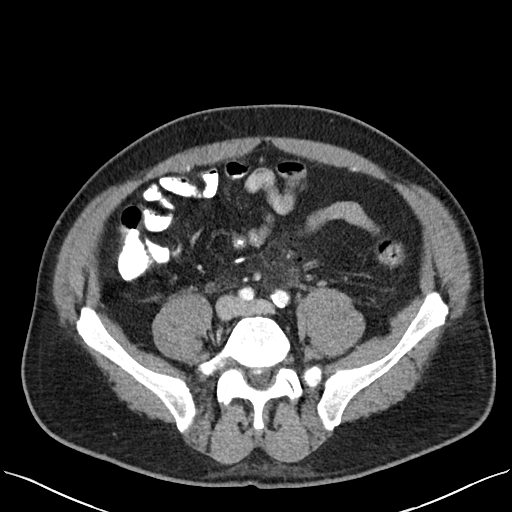
[im 49/97  soft-tissue]
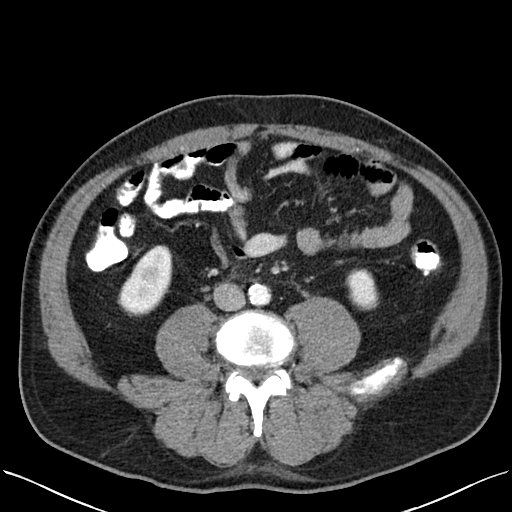
[im 55/97  soft-tissue]
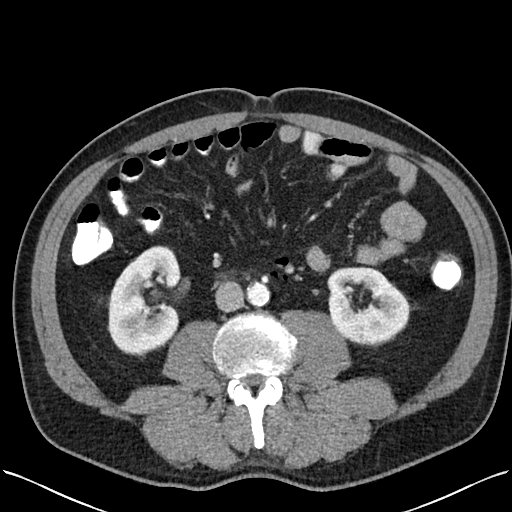
[im 61/97  soft-tissue]
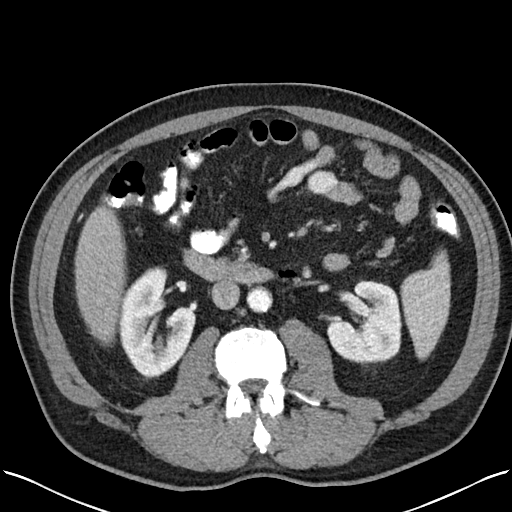
[im 61/97  bone]
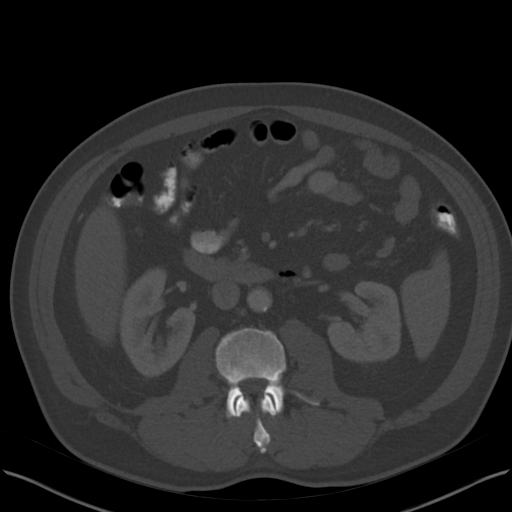
[im 67/97  soft-tissue]
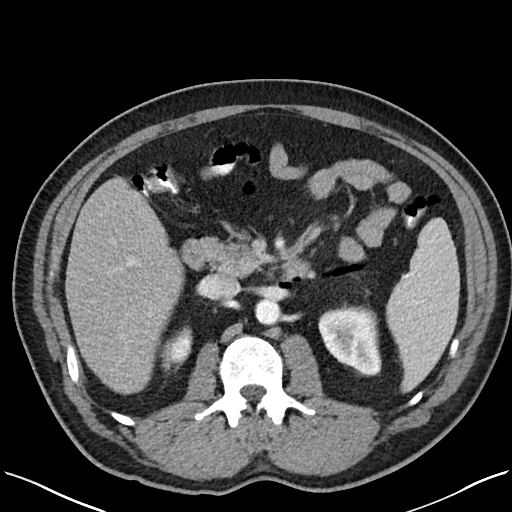
[im 79/97  soft-tissue]
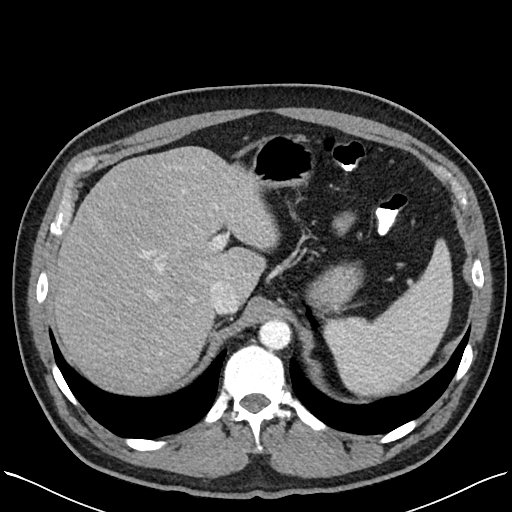
[im 85/97  soft-tissue]
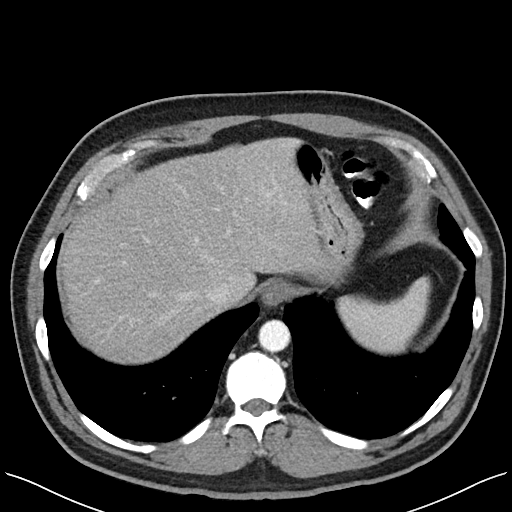
[im 91/97  soft-tissue]
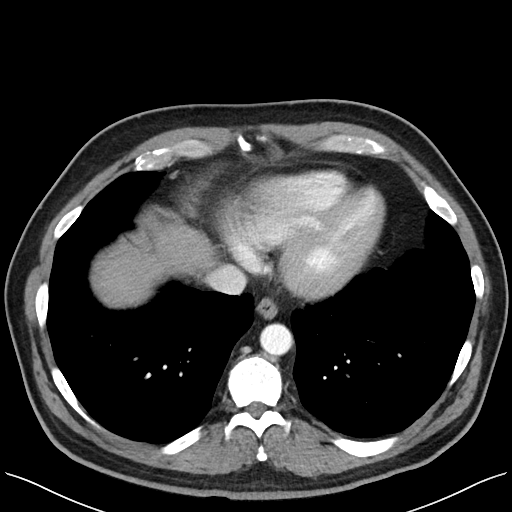

[Series 6: abdomen 3.0 mpr cor · coronal · 0.78mm/px · 3 of 101 slices shown]
[im 34/101  soft-tissue]
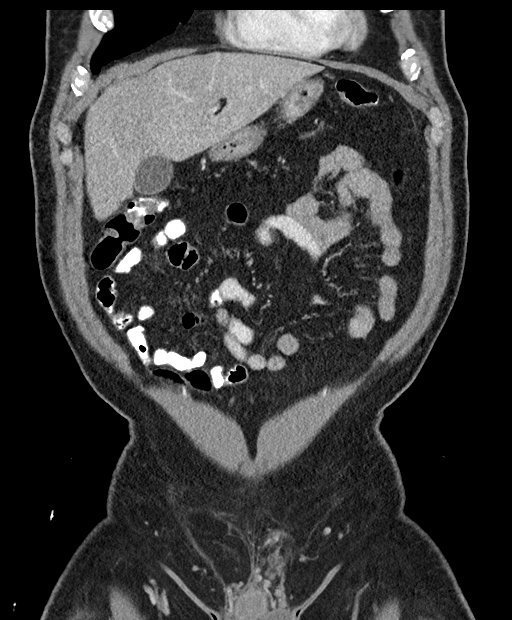
[im 45/101  soft-tissue]
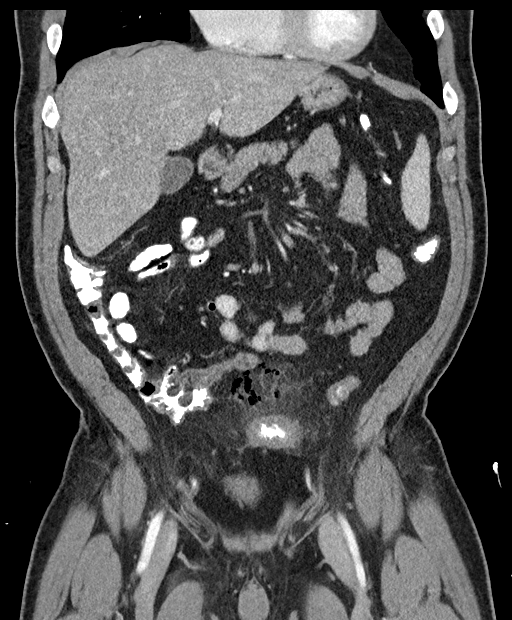
[im 56/101  soft-tissue]
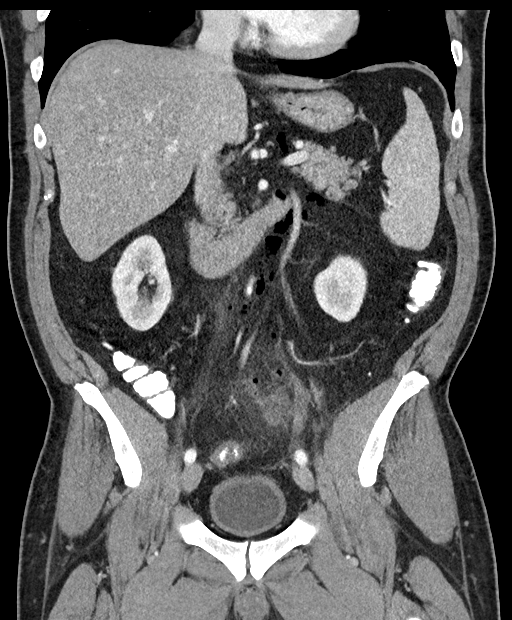

[16 of 46 positions shown; findings below may reference images not displayed]

FINDINGS: Lower chest: Normal.

Hepatobiliary: Mild hepatic steatosis.  Biliary tree is normal.

Pancreas: Unremarkable. No pancreatic ductal dilatation or
surrounding inflammatory changes.

Spleen: Normal in size without focal abnormality.

Adrenals/Urinary Tract: Normal adrenal glands. 12 mm simple
appearing cyst on the lower pole of the right kidney. Kidneys are
otherwise normal. No hydronephrosis. Bladder is normal.

Stomach/Bowel: Again noted is acute sigmoid diverticulitis with a
focal perforation. Air is present in the mesentery extending
superiorly posterior to the pancreas and posterior to the fundus of
the stomach. The amount of air has slightly diminished. There is
increased haziness in the mesentery. The patient now has a 2 cm
poorly defined diverticular abscess. There is a small focal area of
extravasated contrast at the site of perforation best seen on image
71 of series 7 and image 67 of series 3.

There are only a few diverticula in the sigmoid portion of the
colon. The bowel otherwise appears normal.

Vascular/Lymphatic: Aortic atherosclerosis. No enlarged abdominal or
pelvic lymph nodes.

Reproductive: Prostate is unremarkable.

Other: Small bilateral inguinal hernias containing only fat.

Musculoskeletal: No acute or significant osseous findings.
IMPRESSION: 1. Acute sigmoid diverticulitis with perforation. There is now a
small adjacent diverticular abscess. There is evidence of
extravasation of a small amount of contrast from the colon at the
site of perforation. Increased inflammatory changes in the mesentery
since the prior study.
2. Slight decrease in the air in the mesentery. Air extends into the
upper abdomen as described above.
3.

## 2021-04-14 DIAGNOSIS — L4059 Other psoriatic arthropathy: Secondary | ICD-10-CM | POA: Diagnosis not present

## 2021-04-14 DIAGNOSIS — Z79899 Other long term (current) drug therapy: Secondary | ICD-10-CM | POA: Diagnosis not present

## 2021-04-14 DIAGNOSIS — M7710 Lateral epicondylitis, unspecified elbow: Secondary | ICD-10-CM | POA: Diagnosis not present

## 2021-04-14 DIAGNOSIS — M7072 Other bursitis of hip, left hip: Secondary | ICD-10-CM | POA: Diagnosis not present

## 2021-05-04 DIAGNOSIS — M25552 Pain in left hip: Secondary | ICD-10-CM | POA: Diagnosis not present

## 2021-05-12 DIAGNOSIS — M25552 Pain in left hip: Secondary | ICD-10-CM | POA: Diagnosis not present

## 2021-05-19 DIAGNOSIS — M25552 Pain in left hip: Secondary | ICD-10-CM | POA: Diagnosis not present

## 2021-05-26 DIAGNOSIS — M25552 Pain in left hip: Secondary | ICD-10-CM | POA: Diagnosis not present

## 2021-06-02 DIAGNOSIS — M25552 Pain in left hip: Secondary | ICD-10-CM | POA: Diagnosis not present

## 2021-06-07 DIAGNOSIS — M25552 Pain in left hip: Secondary | ICD-10-CM | POA: Diagnosis not present

## 2021-06-23 DIAGNOSIS — M25552 Pain in left hip: Secondary | ICD-10-CM | POA: Diagnosis not present

## 2021-06-30 DIAGNOSIS — M25552 Pain in left hip: Secondary | ICD-10-CM | POA: Diagnosis not present

## 2021-07-05 DIAGNOSIS — M7072 Other bursitis of hip, left hip: Secondary | ICD-10-CM | POA: Diagnosis not present

## 2021-07-05 DIAGNOSIS — L4059 Other psoriatic arthropathy: Secondary | ICD-10-CM | POA: Diagnosis not present

## 2021-07-05 DIAGNOSIS — M549 Dorsalgia, unspecified: Secondary | ICD-10-CM | POA: Diagnosis not present

## 2021-07-05 DIAGNOSIS — Z79899 Other long term (current) drug therapy: Secondary | ICD-10-CM | POA: Diagnosis not present

## 2021-07-07 DIAGNOSIS — I10 Essential (primary) hypertension: Secondary | ICD-10-CM | POA: Diagnosis not present

## 2021-07-07 DIAGNOSIS — E1169 Type 2 diabetes mellitus with other specified complication: Secondary | ICD-10-CM | POA: Diagnosis not present

## 2021-07-07 DIAGNOSIS — E669 Obesity, unspecified: Secondary | ICD-10-CM | POA: Diagnosis not present

## 2021-07-07 DIAGNOSIS — E782 Mixed hyperlipidemia: Secondary | ICD-10-CM | POA: Diagnosis not present

## 2021-07-15 ENCOUNTER — Other Ambulatory Visit: Payer: Self-pay | Admitting: Family Medicine

## 2021-07-15 DIAGNOSIS — R1084 Generalized abdominal pain: Secondary | ICD-10-CM

## 2021-07-18 DIAGNOSIS — M545 Low back pain, unspecified: Secondary | ICD-10-CM | POA: Diagnosis not present

## 2021-07-18 DIAGNOSIS — M25551 Pain in right hip: Secondary | ICD-10-CM | POA: Diagnosis not present

## 2021-07-19 DIAGNOSIS — H7203 Central perforation of tympanic membrane, bilateral: Secondary | ICD-10-CM | POA: Diagnosis not present

## 2021-07-19 DIAGNOSIS — H900 Conductive hearing loss, bilateral: Secondary | ICD-10-CM | POA: Diagnosis not present

## 2021-07-25 DIAGNOSIS — M5416 Radiculopathy, lumbar region: Secondary | ICD-10-CM | POA: Diagnosis not present

## 2021-07-25 DIAGNOSIS — R102 Pelvic and perineal pain: Secondary | ICD-10-CM | POA: Diagnosis not present

## 2021-07-27 ENCOUNTER — Other Ambulatory Visit: Payer: Self-pay

## 2021-07-27 ENCOUNTER — Ambulatory Visit
Admission: RE | Admit: 2021-07-27 | Discharge: 2021-07-27 | Disposition: A | Discharge status: Home / Self Care | Payer: BC Managed Care – PPO | Source: Ambulatory Visit | Attending: Family Medicine | Admitting: Family Medicine

## 2021-07-27 DIAGNOSIS — R109 Unspecified abdominal pain: Secondary | ICD-10-CM | POA: Diagnosis not present

## 2021-07-27 DIAGNOSIS — R161 Splenomegaly, not elsewhere classified: Secondary | ICD-10-CM | POA: Diagnosis not present

## 2021-07-27 DIAGNOSIS — R1084 Generalized abdominal pain: Secondary | ICD-10-CM

## 2021-07-27 DIAGNOSIS — K760 Fatty (change of) liver, not elsewhere classified: Secondary | ICD-10-CM | POA: Diagnosis not present

## 2021-08-01 DIAGNOSIS — M545 Low back pain, unspecified: Secondary | ICD-10-CM | POA: Diagnosis not present

## 2021-08-01 DIAGNOSIS — M25552 Pain in left hip: Secondary | ICD-10-CM | POA: Diagnosis not present

## 2021-09-12 DIAGNOSIS — H35033 Hypertensive retinopathy, bilateral: Secondary | ICD-10-CM | POA: Diagnosis not present

## 2021-09-12 DIAGNOSIS — E119 Type 2 diabetes mellitus without complications: Secondary | ICD-10-CM | POA: Diagnosis not present

## 2021-10-05 DIAGNOSIS — M7072 Other bursitis of hip, left hip: Secondary | ICD-10-CM | POA: Diagnosis not present

## 2021-10-05 DIAGNOSIS — L4059 Other psoriatic arthropathy: Secondary | ICD-10-CM | POA: Diagnosis not present

## 2021-10-05 DIAGNOSIS — M549 Dorsalgia, unspecified: Secondary | ICD-10-CM | POA: Diagnosis not present

## 2021-10-05 DIAGNOSIS — Z79899 Other long term (current) drug therapy: Secondary | ICD-10-CM | POA: Diagnosis not present

## 2021-11-09 DIAGNOSIS — L4059 Other psoriatic arthropathy: Secondary | ICD-10-CM | POA: Diagnosis not present

## 2021-11-15 DIAGNOSIS — L4059 Other psoriatic arthropathy: Secondary | ICD-10-CM | POA: Diagnosis not present

## 2021-11-15 DIAGNOSIS — M549 Dorsalgia, unspecified: Secondary | ICD-10-CM | POA: Diagnosis not present

## 2021-11-15 DIAGNOSIS — Z79899 Other long term (current) drug therapy: Secondary | ICD-10-CM | POA: Diagnosis not present

## 2021-11-15 DIAGNOSIS — M6530 Trigger finger, unspecified finger: Secondary | ICD-10-CM | POA: Diagnosis not present

## 2022-01-06 DIAGNOSIS — Z125 Encounter for screening for malignant neoplasm of prostate: Secondary | ICD-10-CM | POA: Diagnosis not present

## 2022-01-06 DIAGNOSIS — E782 Mixed hyperlipidemia: Secondary | ICD-10-CM | POA: Diagnosis not present

## 2022-01-06 DIAGNOSIS — K921 Melena: Secondary | ICD-10-CM | POA: Diagnosis not present

## 2022-01-06 DIAGNOSIS — R197 Diarrhea, unspecified: Secondary | ICD-10-CM | POA: Diagnosis not present

## 2022-01-18 DIAGNOSIS — H900 Conductive hearing loss, bilateral: Secondary | ICD-10-CM | POA: Diagnosis not present

## 2022-01-18 DIAGNOSIS — H7203 Central perforation of tympanic membrane, bilateral: Secondary | ICD-10-CM | POA: Diagnosis not present

## 2022-01-19 DIAGNOSIS — R1031 Right lower quadrant pain: Secondary | ICD-10-CM | POA: Diagnosis not present

## 2022-01-19 DIAGNOSIS — K5730 Diverticulosis of large intestine without perforation or abscess without bleeding: Secondary | ICD-10-CM | POA: Diagnosis not present

## 2022-01-19 DIAGNOSIS — R194 Change in bowel habit: Secondary | ICD-10-CM | POA: Diagnosis not present

## 2022-01-19 DIAGNOSIS — Z1211 Encounter for screening for malignant neoplasm of colon: Secondary | ICD-10-CM | POA: Diagnosis not present

## 2022-01-20 DIAGNOSIS — E1169 Type 2 diabetes mellitus with other specified complication: Secondary | ICD-10-CM | POA: Diagnosis not present

## 2022-01-20 DIAGNOSIS — Z0000 Encounter for general adult medical examination without abnormal findings: Secondary | ICD-10-CM | POA: Diagnosis not present

## 2022-01-20 DIAGNOSIS — B079 Viral wart, unspecified: Secondary | ICD-10-CM | POA: Diagnosis not present

## 2022-01-20 DIAGNOSIS — L989 Disorder of the skin and subcutaneous tissue, unspecified: Secondary | ICD-10-CM | POA: Diagnosis not present

## 2022-01-20 DIAGNOSIS — Z23 Encounter for immunization: Secondary | ICD-10-CM | POA: Diagnosis not present

## 2022-03-01 DIAGNOSIS — M25552 Pain in left hip: Secondary | ICD-10-CM | POA: Diagnosis not present

## 2022-03-08 DIAGNOSIS — H66012 Acute suppurative otitis media with spontaneous rupture of ear drum, left ear: Secondary | ICD-10-CM | POA: Diagnosis not present

## 2022-03-20 DIAGNOSIS — M549 Dorsalgia, unspecified: Secondary | ICD-10-CM | POA: Diagnosis not present

## 2022-03-20 DIAGNOSIS — Z79899 Other long term (current) drug therapy: Secondary | ICD-10-CM | POA: Diagnosis not present

## 2022-03-20 DIAGNOSIS — L4059 Other psoriatic arthropathy: Secondary | ICD-10-CM | POA: Diagnosis not present

## 2022-03-20 DIAGNOSIS — M1990 Unspecified osteoarthritis, unspecified site: Secondary | ICD-10-CM | POA: Diagnosis not present

## 2022-04-05 DIAGNOSIS — D128 Benign neoplasm of rectum: Secondary | ICD-10-CM | POA: Diagnosis not present

## 2022-04-05 DIAGNOSIS — R194 Change in bowel habit: Secondary | ICD-10-CM | POA: Diagnosis not present

## 2022-04-05 DIAGNOSIS — Z980 Intestinal bypass and anastomosis status: Secondary | ICD-10-CM | POA: Diagnosis not present

## 2022-04-05 DIAGNOSIS — Z1211 Encounter for screening for malignant neoplasm of colon: Secondary | ICD-10-CM | POA: Diagnosis not present

## 2022-04-05 DIAGNOSIS — K621 Rectal polyp: Secondary | ICD-10-CM | POA: Diagnosis not present

## 2022-04-05 DIAGNOSIS — K625 Hemorrhage of anus and rectum: Secondary | ICD-10-CM | POA: Diagnosis not present

## 2022-04-10 DIAGNOSIS — H66012 Acute suppurative otitis media with spontaneous rupture of ear drum, left ear: Secondary | ICD-10-CM | POA: Diagnosis not present

## 2022-05-04 DIAGNOSIS — K580 Irritable bowel syndrome with diarrhea: Secondary | ICD-10-CM | POA: Diagnosis not present

## 2022-05-04 DIAGNOSIS — K5730 Diverticulosis of large intestine without perforation or abscess without bleeding: Secondary | ICD-10-CM | POA: Diagnosis not present

## 2022-05-04 DIAGNOSIS — I10 Essential (primary) hypertension: Secondary | ICD-10-CM | POA: Diagnosis not present

## 2022-05-04 DIAGNOSIS — Z86010 Personal history of colonic polyps: Secondary | ICD-10-CM | POA: Diagnosis not present

## 2022-06-19 DIAGNOSIS — H66012 Acute suppurative otitis media with spontaneous rupture of ear drum, left ear: Secondary | ICD-10-CM | POA: Diagnosis not present

## 2022-07-12 DIAGNOSIS — H66012 Acute suppurative otitis media with spontaneous rupture of ear drum, left ear: Secondary | ICD-10-CM | POA: Diagnosis not present

## 2022-07-12 DIAGNOSIS — H900 Conductive hearing loss, bilateral: Secondary | ICD-10-CM | POA: Diagnosis not present

## 2022-07-26 DIAGNOSIS — E669 Obesity, unspecified: Secondary | ICD-10-CM | POA: Diagnosis not present

## 2022-07-26 DIAGNOSIS — R059 Cough, unspecified: Secondary | ICD-10-CM | POA: Diagnosis not present

## 2022-07-26 DIAGNOSIS — E782 Mixed hyperlipidemia: Secondary | ICD-10-CM | POA: Diagnosis not present

## 2022-07-26 DIAGNOSIS — E1169 Type 2 diabetes mellitus with other specified complication: Secondary | ICD-10-CM | POA: Diagnosis not present

## 2022-07-26 DIAGNOSIS — I10 Essential (primary) hypertension: Secondary | ICD-10-CM | POA: Diagnosis not present

## 2022-09-19 DIAGNOSIS — M1990 Unspecified osteoarthritis, unspecified site: Secondary | ICD-10-CM | POA: Diagnosis not present

## 2022-09-19 DIAGNOSIS — Z79899 Other long term (current) drug therapy: Secondary | ICD-10-CM | POA: Diagnosis not present

## 2022-09-19 DIAGNOSIS — M549 Dorsalgia, unspecified: Secondary | ICD-10-CM | POA: Diagnosis not present

## 2022-09-19 DIAGNOSIS — L4059 Other psoriatic arthropathy: Secondary | ICD-10-CM | POA: Diagnosis not present

## 2023-02-13 DIAGNOSIS — L4059 Other psoriatic arthropathy: Secondary | ICD-10-CM | POA: Diagnosis not present

## 2023-02-13 DIAGNOSIS — M549 Dorsalgia, unspecified: Secondary | ICD-10-CM | POA: Diagnosis not present

## 2023-02-13 DIAGNOSIS — Z79899 Other long term (current) drug therapy: Secondary | ICD-10-CM | POA: Diagnosis not present

## 2023-02-13 DIAGNOSIS — M1990 Unspecified osteoarthritis, unspecified site: Secondary | ICD-10-CM | POA: Diagnosis not present

## 2023-02-14 DIAGNOSIS — Z1159 Encounter for screening for other viral diseases: Secondary | ICD-10-CM | POA: Diagnosis not present

## 2023-02-15 ENCOUNTER — Other Ambulatory Visit: Payer: Self-pay | Admitting: Family Medicine

## 2023-02-15 DIAGNOSIS — Z87891 Personal history of nicotine dependence: Secondary | ICD-10-CM

## 2023-02-20 ENCOUNTER — Ambulatory Visit
Admission: RE | Admit: 2023-02-20 | Discharge: 2023-02-20 | Disposition: A | Discharge status: Home / Self Care | Payer: Medicare HMO | Source: Ambulatory Visit | Attending: Family Medicine | Admitting: Family Medicine

## 2023-02-20 DIAGNOSIS — M542 Cervicalgia: Secondary | ICD-10-CM | POA: Diagnosis not present

## 2023-02-20 DIAGNOSIS — Z87891 Personal history of nicotine dependence: Secondary | ICD-10-CM | POA: Diagnosis not present

## 2023-02-20 DIAGNOSIS — M5412 Radiculopathy, cervical region: Secondary | ICD-10-CM | POA: Diagnosis not present

## 2023-02-20 DIAGNOSIS — Z136 Encounter for screening for cardiovascular disorders: Secondary | ICD-10-CM | POA: Diagnosis not present

## 2023-02-20 DIAGNOSIS — I10 Essential (primary) hypertension: Secondary | ICD-10-CM | POA: Diagnosis not present

## 2023-02-23 ENCOUNTER — Encounter (INDEPENDENT_AMBULATORY_CARE_PROVIDER_SITE_OTHER): Payer: Self-pay

## 2023-02-26 ENCOUNTER — Encounter (INDEPENDENT_AMBULATORY_CARE_PROVIDER_SITE_OTHER): Payer: Self-pay

## 2023-02-26 ENCOUNTER — Ambulatory Visit (INDEPENDENT_AMBULATORY_CARE_PROVIDER_SITE_OTHER): Payer: Medicare HMO | Admitting: Otolaryngology

## 2023-02-26 VITALS — Ht 72.0 in | Wt 232.0 lb

## 2023-02-26 DIAGNOSIS — H7203 Central perforation of tympanic membrane, bilateral: Secondary | ICD-10-CM

## 2023-02-28 DIAGNOSIS — H7203 Central perforation of tympanic membrane, bilateral: Secondary | ICD-10-CM | POA: Insufficient documentation

## 2023-02-28 NOTE — Progress Notes (Signed)
Patient ID: John Morse, male   DOB: [birth year], 67 y.o.   MRN: [id number]  Follow-up: Chronic left ear infection, bilateral tympanic membrane perforations  HPI: The patient is a 67 year old male who returns today for his follow-up evaluation.  The patient has a history of bilateral 50% anterior tympanic membrane perforations.  He was previously seen for recurrent left ear infection.  At his last visit, his left otitis media with otorrhea had improved.  He was continued on prophylactic Ciprodex eardrops.  The patient returns today reporting no recent infection.  He denies any significant otalgia or dizziness.  Exam: General: Communicates without difficulty, well nourished, no acute distress. Head: Normocephalic, no evidence injury, no tenderness, facial buttresses intact without stepoff. Face/sinus: No tenderness to palpation and percussion. Facial movement is normal and symmetric. Eyes: PERRL, EOMI. No scleral icterus, conjunctivae clear. Neuro: CN II exam reveals vision grossly intact. No nystagmus at any point of gaze. Ears: Auricles well formed without lesions.  The patient is noted to have bilateral anterior tympanic membrane perforations. Nose: External evaluation reveals normal support and skin without lesions. Dorsum is intact. Anterior rhinoscopy reveals pink mucosa over anterior aspect of inferior turbinates and intact septum. No purulence noted. Oral:  Oral cavity and oropharynx are intact, symmetric, without erythema or edema. Mucosa is moist without lesions. Neck: Full range of motion without pain. There is no significant lymphadenopathy. No masses palpable. Thyroid bed within normal limits to palpation. Parotid glands and submandibular glands equal bilaterally without mass. Trachea is midline. Neuro:  CN 2-12 grossly intact. Gait normal.   Assessment  1.  Bilateral 50% anterior tympanic membrane perforations. 2.  No acute infection is noted today. 3.  Subjectively stable bilateral  conductive hearing loss.  Plan  1.  The physical exam findings are reviewed with the patient. 2.  Continue the use of Ciprodex weekly to prevent future infections. 3.  Bilateral dry ear precautions. 4.  The patient will return for reevaluation in 12 months.

## 2023-03-15 DIAGNOSIS — M542 Cervicalgia: Secondary | ICD-10-CM | POA: Diagnosis not present

## 2023-03-15 DIAGNOSIS — M5412 Radiculopathy, cervical region: Secondary | ICD-10-CM | POA: Diagnosis not present

## 2023-03-19 DIAGNOSIS — Z0100 Encounter for examination of eyes and vision without abnormal findings: Secondary | ICD-10-CM | POA: Diagnosis not present

## 2023-03-19 DIAGNOSIS — H5212 Myopia, left eye: Secondary | ICD-10-CM | POA: Diagnosis not present

## 2023-03-21 DIAGNOSIS — M542 Cervicalgia: Secondary | ICD-10-CM | POA: Diagnosis not present

## 2023-03-21 DIAGNOSIS — M5412 Radiculopathy, cervical region: Secondary | ICD-10-CM | POA: Diagnosis not present

## 2023-03-23 DIAGNOSIS — M542 Cervicalgia: Secondary | ICD-10-CM | POA: Diagnosis not present

## 2023-03-23 DIAGNOSIS — M5412 Radiculopathy, cervical region: Secondary | ICD-10-CM | POA: Diagnosis not present

## 2023-03-26 DIAGNOSIS — M5412 Radiculopathy, cervical region: Secondary | ICD-10-CM | POA: Diagnosis not present

## 2023-03-26 DIAGNOSIS — M542 Cervicalgia: Secondary | ICD-10-CM | POA: Diagnosis not present

## 2023-04-02 DIAGNOSIS — M542 Cervicalgia: Secondary | ICD-10-CM | POA: Diagnosis not present

## 2023-04-02 DIAGNOSIS — M5412 Radiculopathy, cervical region: Secondary | ICD-10-CM | POA: Diagnosis not present

## 2023-04-17 DIAGNOSIS — M542 Cervicalgia: Secondary | ICD-10-CM | POA: Diagnosis not present

## 2023-05-11 DIAGNOSIS — M5412 Radiculopathy, cervical region: Secondary | ICD-10-CM | POA: Diagnosis not present

## 2023-05-18 DIAGNOSIS — M5412 Radiculopathy, cervical region: Secondary | ICD-10-CM | POA: Diagnosis not present

## 2023-05-18 DIAGNOSIS — M542 Cervicalgia: Secondary | ICD-10-CM | POA: Diagnosis not present

## 2023-05-29 DIAGNOSIS — L4059 Other psoriatic arthropathy: Secondary | ICD-10-CM | POA: Diagnosis not present

## 2023-05-29 DIAGNOSIS — M6530 Trigger finger, unspecified finger: Secondary | ICD-10-CM | POA: Diagnosis not present

## 2023-05-29 DIAGNOSIS — M79645 Pain in left finger(s): Secondary | ICD-10-CM | POA: Diagnosis not present

## 2023-05-29 DIAGNOSIS — Z79899 Other long term (current) drug therapy: Secondary | ICD-10-CM | POA: Diagnosis not present

## 2023-06-05 DIAGNOSIS — M5412 Radiculopathy, cervical region: Secondary | ICD-10-CM | POA: Diagnosis not present

## 2023-06-07 ENCOUNTER — Other Ambulatory Visit (HOSPITAL_COMMUNITY): Payer: Self-pay | Admitting: Gastroenterology

## 2023-06-07 DIAGNOSIS — I10 Essential (primary) hypertension: Secondary | ICD-10-CM | POA: Diagnosis not present

## 2023-06-07 DIAGNOSIS — R1310 Dysphagia, unspecified: Secondary | ICD-10-CM

## 2023-06-07 DIAGNOSIS — E669 Obesity, unspecified: Secondary | ICD-10-CM | POA: Diagnosis not present

## 2023-06-07 DIAGNOSIS — E119 Type 2 diabetes mellitus without complications: Secondary | ICD-10-CM | POA: Diagnosis not present

## 2023-06-07 DIAGNOSIS — E782 Mixed hyperlipidemia: Secondary | ICD-10-CM | POA: Diagnosis not present

## 2023-06-07 DIAGNOSIS — K580 Irritable bowel syndrome with diarrhea: Secondary | ICD-10-CM | POA: Diagnosis not present

## 2023-06-07 DIAGNOSIS — Z860100 Personal history of colon polyps, unspecified: Secondary | ICD-10-CM | POA: Diagnosis not present

## 2023-06-07 DIAGNOSIS — K219 Gastro-esophageal reflux disease without esophagitis: Secondary | ICD-10-CM | POA: Diagnosis not present

## 2023-06-19 DIAGNOSIS — L821 Other seborrheic keratosis: Secondary | ICD-10-CM | POA: Diagnosis not present

## 2023-06-19 DIAGNOSIS — D485 Neoplasm of uncertain behavior of skin: Secondary | ICD-10-CM | POA: Diagnosis not present

## 2023-06-19 DIAGNOSIS — D2272 Melanocytic nevi of left lower limb, including hip: Secondary | ICD-10-CM | POA: Diagnosis not present

## 2023-06-19 DIAGNOSIS — L400 Psoriasis vulgaris: Secondary | ICD-10-CM | POA: Diagnosis not present

## 2023-06-19 DIAGNOSIS — R208 Other disturbances of skin sensation: Secondary | ICD-10-CM | POA: Diagnosis not present

## 2023-06-19 DIAGNOSIS — L814 Other melanin hyperpigmentation: Secondary | ICD-10-CM | POA: Diagnosis not present

## 2023-06-21 ENCOUNTER — Ambulatory Visit (HOSPITAL_COMMUNITY)
Admission: RE | Admit: 2023-06-21 | Discharge: 2023-06-21 | Disposition: A | Discharge status: Home / Self Care | Payer: Medicare HMO | Source: Ambulatory Visit | Attending: Gastroenterology | Admitting: Gastroenterology

## 2023-06-21 DIAGNOSIS — R1310 Dysphagia, unspecified: Secondary | ICD-10-CM | POA: Insufficient documentation

## 2023-06-21 DIAGNOSIS — K224 Dyskinesia of esophagus: Secondary | ICD-10-CM | POA: Diagnosis not present

## 2023-06-27 DIAGNOSIS — M5412 Radiculopathy, cervical region: Secondary | ICD-10-CM | POA: Diagnosis not present

## 2023-07-27 DIAGNOSIS — K208 Other esophagitis without bleeding: Secondary | ICD-10-CM | POA: Diagnosis not present

## 2023-07-27 DIAGNOSIS — R1310 Dysphagia, unspecified: Secondary | ICD-10-CM | POA: Diagnosis not present

## 2023-07-27 DIAGNOSIS — K2970 Gastritis, unspecified, without bleeding: Secondary | ICD-10-CM | POA: Diagnosis not present

## 2023-07-27 DIAGNOSIS — K2289 Other specified disease of esophagus: Secondary | ICD-10-CM | POA: Diagnosis not present

## 2023-07-27 DIAGNOSIS — K2950 Unspecified chronic gastritis without bleeding: Secondary | ICD-10-CM | POA: Diagnosis not present

## 2023-07-27 DIAGNOSIS — K219 Gastro-esophageal reflux disease without esophagitis: Secondary | ICD-10-CM | POA: Diagnosis not present

## 2023-08-28 DIAGNOSIS — L4059 Other psoriatic arthropathy: Secondary | ICD-10-CM | POA: Diagnosis not present

## 2023-08-28 DIAGNOSIS — Z79899 Other long term (current) drug therapy: Secondary | ICD-10-CM | POA: Diagnosis not present

## 2023-08-28 DIAGNOSIS — M5030 Other cervical disc degeneration, unspecified cervical region: Secondary | ICD-10-CM | POA: Diagnosis not present

## 2023-08-28 DIAGNOSIS — M6530 Trigger finger, unspecified finger: Secondary | ICD-10-CM | POA: Diagnosis not present

## 2023-11-05 ENCOUNTER — Ambulatory Visit (INDEPENDENT_AMBULATORY_CARE_PROVIDER_SITE_OTHER)

## 2023-11-05 ENCOUNTER — Encounter: Payer: Self-pay | Admitting: Podiatry

## 2023-11-05 ENCOUNTER — Ambulatory Visit: Admitting: Podiatry

## 2023-11-05 VITALS — Ht 72.0 in | Wt 232.0 lb

## 2023-11-05 DIAGNOSIS — M205X2 Other deformities of toe(s) (acquired), left foot: Secondary | ICD-10-CM

## 2023-11-05 DIAGNOSIS — M722 Plantar fascial fibromatosis: Secondary | ICD-10-CM | POA: Diagnosis not present

## 2023-11-05 MED ORDER — TRIAMCINOLONE ACETONIDE 10 MG/ML IJ SUSP
10.0000 mg | Freq: Once | INTRAMUSCULAR | Status: AC
  Administered 2023-11-05: 10 mg via INTRA_ARTICULAR

## 2023-11-05 MED ORDER — DICLOFENAC SODIUM 75 MG PO TBEC: 75.0000 mg | DELAYED_RELEASE_TABLET | Freq: Two times a day (BID) | ORAL | 2 refills | Status: AC

## 2023-11-05 NOTE — Progress Notes (Signed)
 Subjective:   Patient ID: John Morse, male   DOB: 68 y.o.   MRN: [id number]   HPI Patient presents stating having a lot of pain in the bottom of the left heel for around 2 months and I do not remember any specific injury.  Also some discomfort around the big toe joint but overall doing well with that and well with previous surgery done.  Patient's not been seen in 7 years does not smoke likes to be active   Review of Systems  All other systems reviewed and are negative.       Objective:  Physical Exam Vitals and nursing note reviewed.  Constitutional:      Appearance: He is well-developed.  Pulmonary:     Effort: Pulmonary effort is normal.   Musculoskeletal:        General: Normal range of motion.   Skin:    General: Skin is warm.   Neurological:     Mental Status: He is alert.     Neurovascular status intact muscle strength was found to be adequate range of motion within normal limits.  Patient has exquisite discomfort medial fascial band of the left at the insertional point tendon calcaneus with fluid buildup and good digital perfusion well oriented x 3 with moderate equinus     Assessment:  Acute plantar fasciitis left with inflammation fluid of the medial band with mild hallux limitus deformity left     Plan:  H&P reviewed sterile prep went ahead today injected the plantar fascia at insertion 3 mg Kenalog  5 mg Xylocaine  applied fascial brace with all instructions on usage fitted properly into the lower leg and advised on shoe gear modifications placing on oral diclofenac  today  X-rays indicate that there is minimal spur formation heel slight changes around the big toe joint left screw in place second metatarsal

## 2023-11-15 IMAGING — US US ABDOMEN COMPLETE
1 series · 14 of 25 positions shown · non-contrast
Comparison: None.

CLINICAL DATA: Right mid abdominal pain for 6 months.

EXAM:
ABDOMEN ULTRASOUND COMPLETE

[Series 1: us abdomen complete · 0.28mm/px · 14 of 105 slices shown]
[im 1/105]
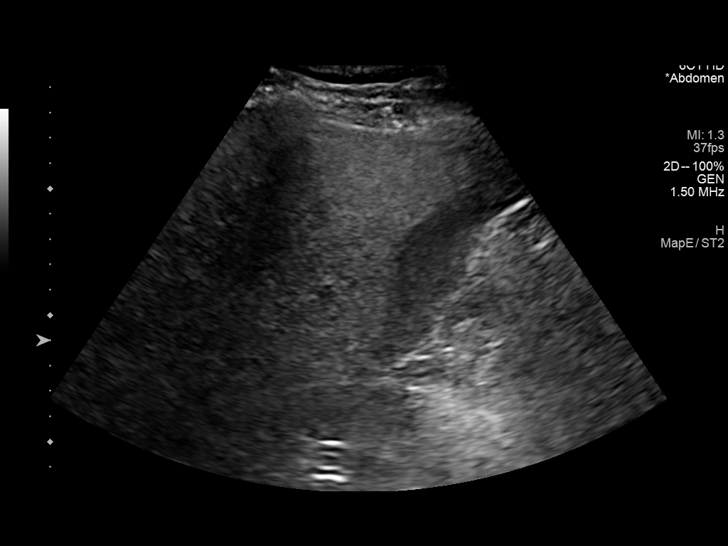
[im 9/105]
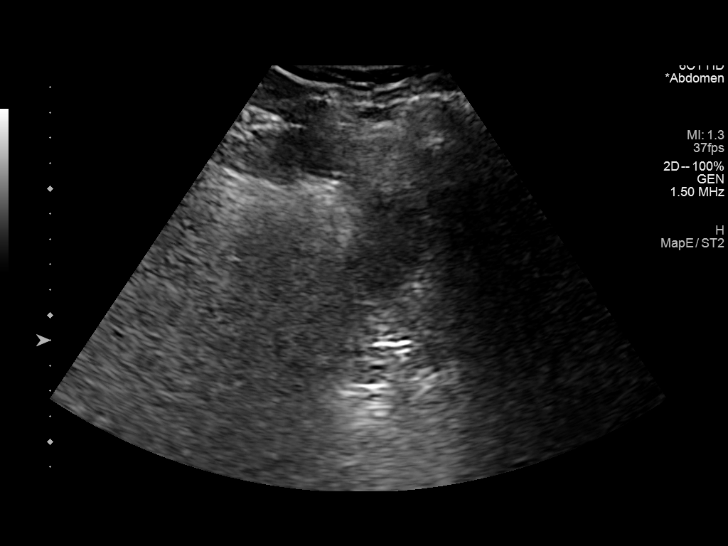
[im 18/105]
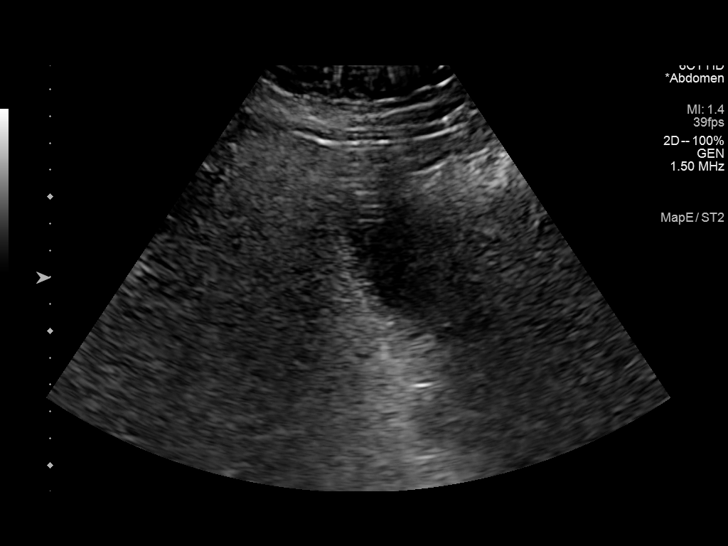
[im 27/105]
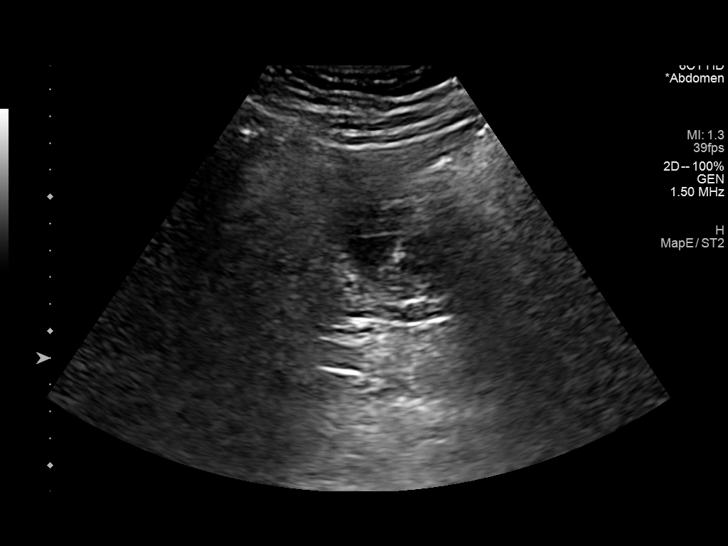
[im 35/105]
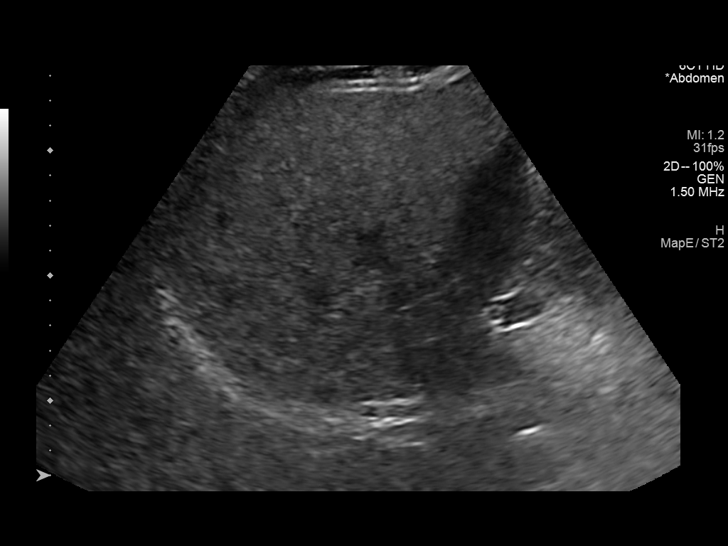
[im 40/105]
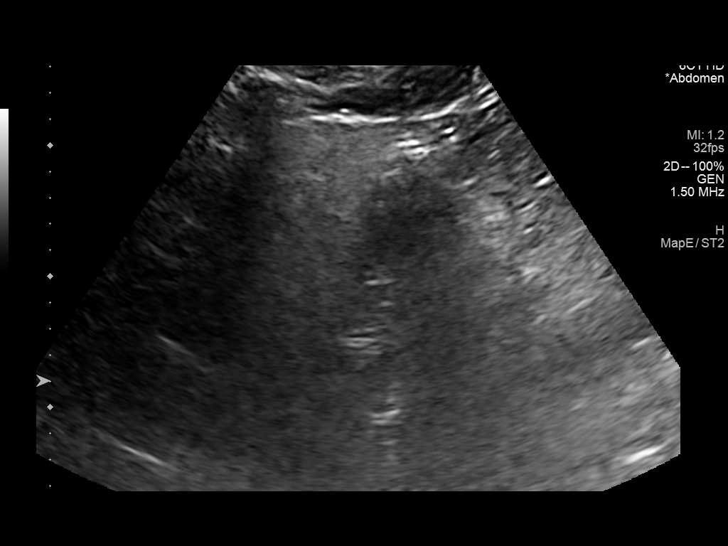
[im 48/105]
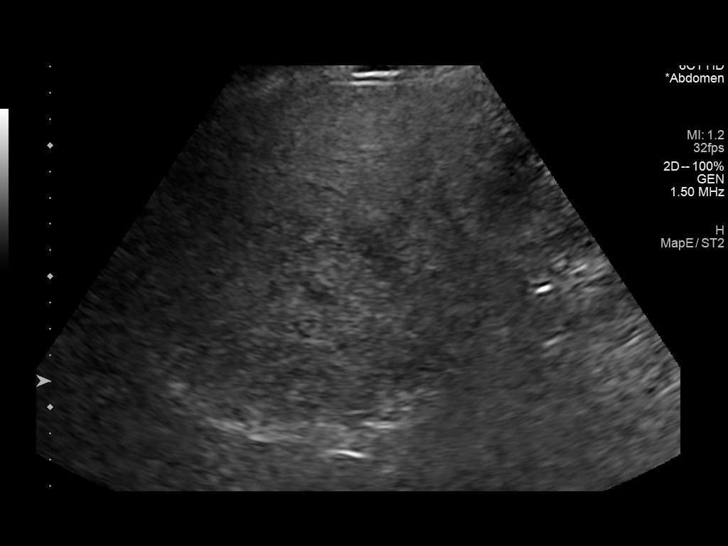
[im 57/105]
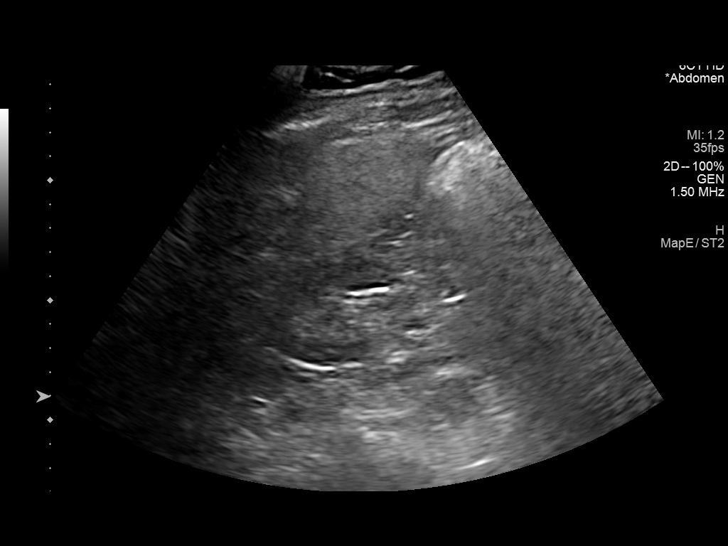
[im 66/105]
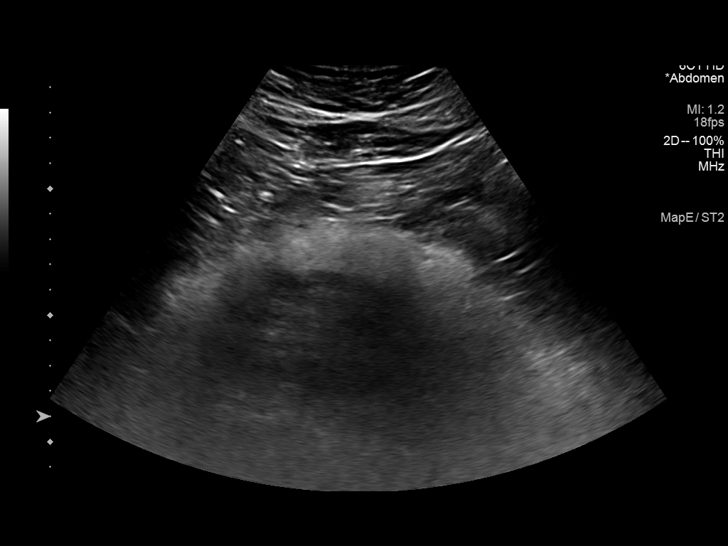
[im 70/105]
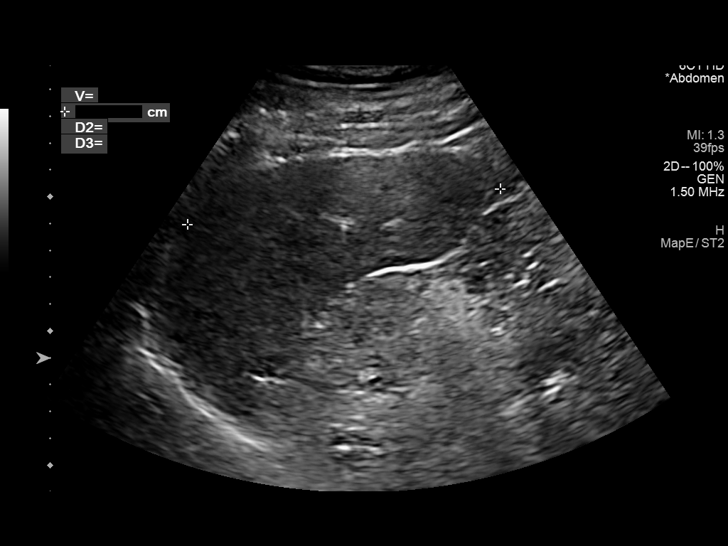
[im 79/105]
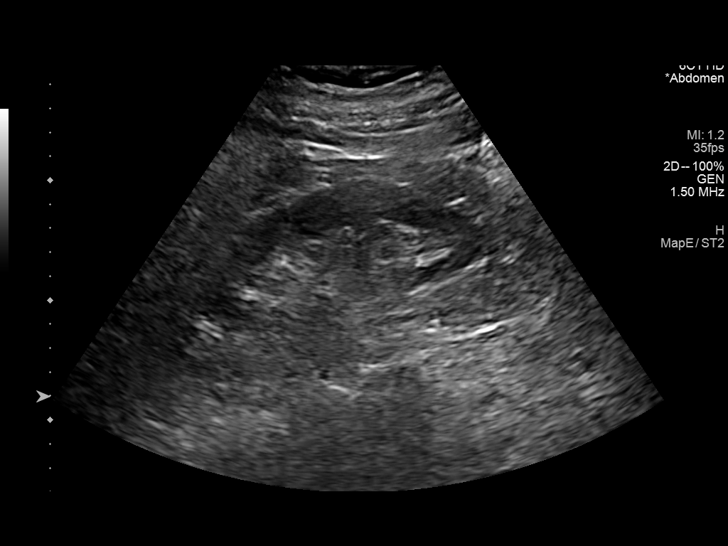
[im 87/105]
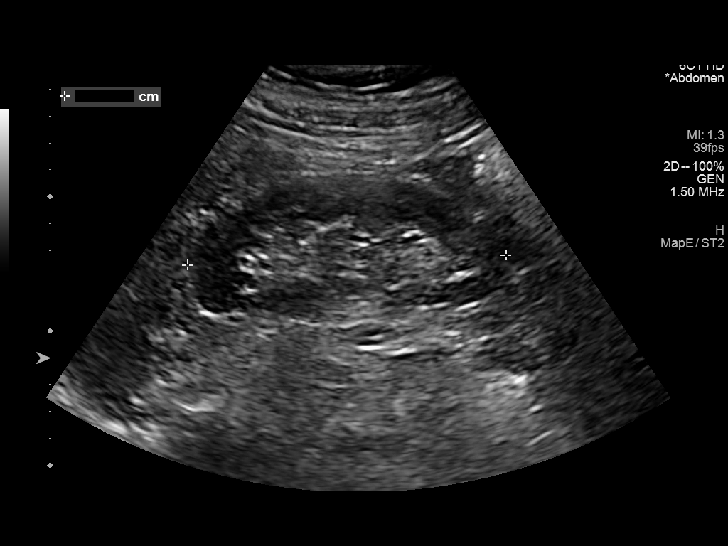
[im 96/105]
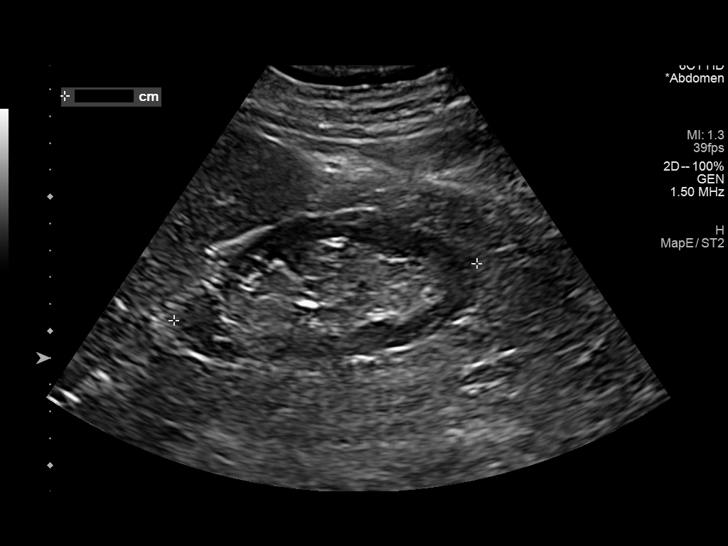
[im 105/105]
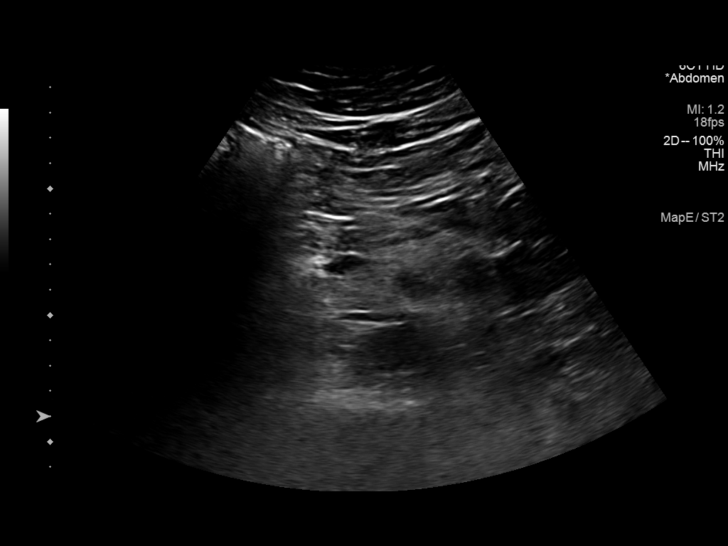

[14 of 25 positions shown; findings below may reference images not displayed]

FINDINGS: Gallbladder: No gallstones or wall thickening visualized. No
sonographic Murphy sign noted by sonographer.

Common bile duct: Diameter: 3 mm

Liver: Diffusely increased in echogenicity. No focal lesion. Portal
vein is patent on color Doppler imaging with normal direction of
blood flow towards the liver.

IVC: No abnormality visualized.

Pancreas: Not visualized.

Spleen: Mildly enlarged measuring 14 cm.

Right Kidney: Length: 12.5 cm. Echogenicity within normal limits. No
mass or hydronephrosis visualized.

Left Kidney: Length: 11.7 cm. Echogenicity within normal limits. No
mass or hydronephrosis visualized.

Abdominal aorta: No aneurysm visualized.

Other findings: None.
IMPRESSION: Hepatic steatosis.

Mild splenomegaly.

The region of the pancreas is not well visualized due to acoustic
shadowing. If there is persistent epigastric pain, recommend CT for
further evaluation.

## 2023-11-19 ENCOUNTER — Ambulatory Visit: Admitting: Podiatry

## 2023-11-19 DIAGNOSIS — M722 Plantar fascial fibromatosis: Secondary | ICD-10-CM

## 2023-11-20 NOTE — Progress Notes (Signed)
 Subjective:   Patient ID: John Morse, male   DOB: 68 y.o.   MRN: [id number]   HPI Patient states he is much improved over before mild discomfort but overall very pleased   ROS      Objective:  Physical Exam  Neurovascular status intact discomfort in the plantar heel left which has improved and is only mildly discomforting at the current time     Assessment:  Acute plantar fasciitis left it does appear to be improving     Plan:  H&P reviewed I have recommended the continuation of conservative care continuation of ice therapy stretching and shoe gear modifications.  Reappoint as needed

## 2023-12-25 DIAGNOSIS — L4059 Other psoriatic arthropathy: Secondary | ICD-10-CM | POA: Diagnosis not present

## 2023-12-25 DIAGNOSIS — M1990 Unspecified osteoarthritis, unspecified site: Secondary | ICD-10-CM | POA: Diagnosis not present

## 2023-12-25 DIAGNOSIS — M6530 Trigger finger, unspecified finger: Secondary | ICD-10-CM | POA: Diagnosis not present

## 2023-12-25 DIAGNOSIS — Z79899 Other long term (current) drug therapy: Secondary | ICD-10-CM | POA: Diagnosis not present

## 2024-01-01 ENCOUNTER — Ambulatory Visit: Admitting: Orthopaedic Surgery

## 2024-01-01 DIAGNOSIS — M65332 Trigger finger, left middle finger: Secondary | ICD-10-CM | POA: Diagnosis not present

## 2024-01-01 MED ORDER — LIDOCAINE HCL 1 % IJ SOLN
0.3000 mL | INTRAMUSCULAR | Status: AC | PRN
  Administered 2024-01-01: .3 mL

## 2024-01-01 MED ORDER — BUPIVACAINE HCL 0.5 % IJ SOLN
0.3300 mL | INTRAMUSCULAR | Status: AC | PRN
  Administered 2024-01-01: .33 mL

## 2024-01-01 MED ORDER — METHYLPREDNISOLONE ACETATE 40 MG/ML IJ SUSP
13.3300 mg | INTRAMUSCULAR | Status: AC | PRN
  Administered 2024-01-01: 13.33 mg
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
## 2024-02-19 ENCOUNTER — Other Ambulatory Visit: Payer: Self-pay | Admitting: Family Medicine

## 2024-02-19 DIAGNOSIS — R1011 Right upper quadrant pain: Secondary | ICD-10-CM

## 2024-02-22 ENCOUNTER — Ambulatory Visit
Admission: RE | Admit: 2024-02-22 | Discharge: 2024-02-22 | Disposition: A | Discharge status: Home / Self Care | Source: Ambulatory Visit | Attending: Family Medicine | Admitting: Family Medicine

## 2024-02-22 DIAGNOSIS — K4090 Unilateral inguinal hernia, without obstruction or gangrene, not specified as recurrent: Secondary | ICD-10-CM | POA: Diagnosis not present

## 2024-02-22 DIAGNOSIS — R1011 Right upper quadrant pain: Secondary | ICD-10-CM

## 2024-02-22 MED ORDER — IOPAMIDOL (ISOVUE-300) INJECTION 61%
100.0000 mL | Freq: Once | INTRAVENOUS | Status: AC | PRN
  Administered 2024-02-22: 100 mL via INTRAVENOUS

## 2024-03-06 DIAGNOSIS — K219 Gastro-esophageal reflux disease without esophagitis: Secondary | ICD-10-CM | POA: Diagnosis not present

## 2024-03-06 DIAGNOSIS — R197 Diarrhea, unspecified: Secondary | ICD-10-CM | POA: Diagnosis not present

## 2024-03-10 ENCOUNTER — Encounter: Payer: Self-pay | Admitting: Radiology

## 2024-03-19 DIAGNOSIS — H2513 Age-related nuclear cataract, bilateral: Secondary | ICD-10-CM | POA: Diagnosis not present

## 2024-03-19 DIAGNOSIS — E119 Type 2 diabetes mellitus without complications: Secondary | ICD-10-CM | POA: Diagnosis not present

## 2024-03-19 DIAGNOSIS — H5201 Hypermetropia, right eye: Secondary | ICD-10-CM | POA: Diagnosis not present

## 2024-03-19 DIAGNOSIS — H53143 Visual discomfort, bilateral: Secondary | ICD-10-CM | POA: Diagnosis not present

## 2024-03-19 DIAGNOSIS — H52223 Regular astigmatism, bilateral: Secondary | ICD-10-CM | POA: Diagnosis not present

## 2024-03-19 DIAGNOSIS — H524 Presbyopia: Secondary | ICD-10-CM | POA: Diagnosis not present

## 2024-03-19 DIAGNOSIS — H5213 Myopia, bilateral: Secondary | ICD-10-CM | POA: Diagnosis not present

## 2024-03-26 DIAGNOSIS — L4059 Other psoriatic arthropathy: Secondary | ICD-10-CM | POA: Diagnosis not present

## 2024-03-26 DIAGNOSIS — M1990 Unspecified osteoarthritis, unspecified site: Secondary | ICD-10-CM | POA: Diagnosis not present

## 2024-03-26 DIAGNOSIS — M6530 Trigger finger, unspecified finger: Secondary | ICD-10-CM | POA: Diagnosis not present

## 2024-03-26 DIAGNOSIS — Z79899 Other long term (current) drug therapy: Secondary | ICD-10-CM | POA: Diagnosis not present

## 2024-06-12 ENCOUNTER — Ambulatory Visit: Admitting: Physician Assistant

## 2024-06-12 DIAGNOSIS — M65332 Trigger finger, left middle finger: Secondary | ICD-10-CM

## 2024-06-12 LAB — POCT GLYCOSYLATED HEMOGLOBIN (HGB A1C): Hemoglobin A1C: 5.6 % (ref 4.0–5.6)
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
## 2024-06-12 NOTE — Addendum Note (Signed)
 Addended by: Kaled Allende on: 06/12/2024 11:14 AM   Modules accepted: Orders
